# Patient Record
Sex: Male | Born: 1990 | Race: Black or African American | Hispanic: No | Marital: Single | Smoking: Current every day smoker
Health system: Southern US, Community
[De-identification: ages and names within clinical notes are randomized; demographics above are authoritative.]

## PROBLEM LIST (undated history)

## (undated) ENCOUNTER — Emergency Department (HOSPITAL_COMMUNITY): Admission: EM | Discharge status: Absent without leave | Payer: Self-pay

## (undated) DIAGNOSIS — R569 Unspecified convulsions: Secondary | ICD-10-CM

## (undated) HISTORY — PX: FOOT SURGERY: SHX648

---
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
## 2009-11-17 ENCOUNTER — Emergency Department (HOSPITAL_COMMUNITY): Admission: EM | Admit: 2009-11-17 | Discharge: 2009-11-18 | Payer: Self-pay | Admitting: Emergency Medicine

## 2010-01-06 ENCOUNTER — Ambulatory Visit: Payer: Self-pay | Admitting: Family Medicine

## 2010-02-06 ENCOUNTER — Encounter: Payer: Self-pay | Admitting: Family Medicine

## 2010-02-06 ENCOUNTER — Ambulatory Visit: Payer: Self-pay | Admitting: Family Medicine

## 2010-02-09 LAB — CONVERTED CEMR LAB
BUN: 16 mg/dL (ref 6–23)
CO2: 22 meq/L (ref 19–32)
Calcium: 9.3 mg/dL (ref 8.4–10.5)
Chloride: 109 meq/L (ref 96–112)
Cholesterol: 124 mg/dL (ref 0–200)
Creatinine, Ser: 0.99 mg/dL (ref 0.40–1.50)
Glucose, Bld: 89 mg/dL (ref 70–99)
HDL: 34 mg/dL — ABNORMAL LOW (ref >39)
LDL Cholesterol: 81 mg/dL (ref 0–99)
Potassium: 4.1 meq/L (ref 3.5–5.3)
Sodium: 142 meq/L (ref 135–145)
TSH: 3.444 microintl units/mL (ref 0.350–4.500)
Total CHOL/HDL Ratio: 3.6
Triglycerides: 45 mg/dL (ref <150)
VLDL: 9 mg/dL (ref 0–40)

## 2010-03-05 ENCOUNTER — Ambulatory Visit: Payer: Self-pay | Admitting: Family Medicine

## 2010-08-25 ENCOUNTER — Ambulatory Visit: Payer: Self-pay | Admitting: Family Medicine

## 2010-09-15 ENCOUNTER — Encounter: Payer: Self-pay | Admitting: Family Medicine

## 2010-09-17 ENCOUNTER — Emergency Department (HOSPITAL_COMMUNITY)
Admission: EM | Admit: 2010-09-17 | Discharge: 2010-09-17 | Payer: Self-pay | Source: Home / Self Care | Admitting: Emergency Medicine

## 2010-09-17 LAB — COMPREHENSIVE METABOLIC PANEL
ALT: 22 U/L (ref 0–53)
AST: 16 U/L (ref 0–37)
Albumin: 3.5 g/dL (ref 3.5–5.2)
Alkaline Phosphatase: 66 U/L (ref 39–117)
BUN: 10 mg/dL (ref 6–23)
CO2: 26 mEq/L (ref 19–32)
Calcium: 9 mg/dL (ref 8.4–10.5)
Chloride: 107 mEq/L (ref 96–112)
Creatinine, Ser: 1.05 mg/dL (ref 0.4–1.5)
GFR calc Af Amer: >60 mL/min (ref >60)
GFR calc non Af Amer: >60 mL/min (ref >60)
Glucose, Bld: 87 mg/dL (ref 70–99)
Potassium: 3.8 mEq/L (ref 3.5–5.1)
Sodium: 139 mEq/L (ref 135–145)
Total Bilirubin: 0.5 mg/dL (ref 0.3–1.2)
Total Protein: 6.9 g/dL (ref 6.0–8.3)

## 2010-09-17 LAB — DIFFERENTIAL
Basophils Absolute: 0 10*3/uL (ref 0.0–0.1)
Basophils Relative: 0 % (ref 0–1)
Eosinophils Absolute: 0 10*3/uL (ref 0.0–0.7)
Eosinophils Relative: 1 % (ref 0–5)
Lymphocytes Relative: 23 % (ref 12–46)
Lymphs Abs: 1.3 10*3/uL (ref 0.7–4.0)
Monocytes Absolute: 0.5 10*3/uL (ref 0.1–1.0)
Monocytes Relative: 8 % (ref 3–12)
Neutro Abs: 3.9 10*3/uL (ref 1.7–7.7)
Neutrophils Relative %: 68 % (ref 43–77)

## 2010-09-17 LAB — CBC
HCT: 44.9 % (ref 39.0–52.0)
Hemoglobin: 15.1 g/dL (ref 13.0–17.0)
MCH: 29.9 pg (ref 26.0–34.0)
MCHC: 33.6 g/dL (ref 30.0–36.0)
MCV: 88.9 fL (ref 78.0–100.0)
Platelets: 206 10*3/uL (ref 150–400)
RBC: 5.05 MIL/uL (ref 4.22–5.81)
RDW: 13.5 % (ref 11.5–15.5)
WBC: 5.7 10*3/uL (ref 4.0–10.5)

## 2010-09-17 LAB — LIPASE, BLOOD: Lipase: 14 U/L (ref 11–59)

## 2010-10-15 NOTE — Assessment & Plan Note (Signed)
Summary: nutrition counseling   Vital Signs:  Patient profile:   20 year old male Height:      65.25 inches Weight:      278.2 pounds BMI:     46.11  Primary Care Provider:  Marisue Ivan  MD   History of Present Illness: 20yo M here for nutritional counseling  24 hour recall: 7am- Cheerios (3 cups) in 2% milk and water; 11am- spinach salad w/ tomatoes, cucumbers, carrots with grilled chicken breast w/ lite basalmic dressing with water; 7pm- Grilled chicken, stir fry spinach w/ olive oil, salad, water;   Physical Activity: Walking every morning; playing basketball several hours a day  Hx of weight loss programs: None  Current Medications (verified): 1)  None  Allergies (verified): No Known Drug Allergies  Physical Exam  General:  VS Reviewed. Morbidly obese, NAD.    Impression & Recommendations:  Problem # 1:  OBESITY, NOS (ICD-278.00) Assessment Improved  BMI 46 Loss 4 lbs since previous visit.  Pt doing a great job of losing weight, maintaing physical activity, and has proper overall nutrition. Good family and friend support. Interventions: Advised to implement afternoon snack (fruits or yogurt).  titrate down to skim milk.  Implement weight training.   Follow up with Dr. Gerilyn Pilgrim in 4 weeks.  Orders: [facility]- Est Level  3 (16109)  Patient Instructions: 1)  Follow up with Dr. Gerilyn Pilgrim in 4 weeks 2)  Recommendations- Try to slowly titrate down the milk to skim milk. 3)  Consider a mid-afternoon snack (avoid going 5 hours without eating something) 4)  Good food choices: Fruits, yogurt, frozen fruit pops. 5)  Encourage regular calcium intake. 6)  If you are exercising for more than an hour, consider taking in Gatorade (G2 or propel). 7)  If you are still having muscle spasms and cramps, please call the clinic to be evaluated.

## 2010-10-15 NOTE — Progress Notes (Signed)
Summary: ROI  ROI   Imported By: De Nurse 09/24/2010 16:41:15  _____________________________________________________________________  External Attachment:    Type:   Image     Comment:   External Document

## 2010-10-15 NOTE — Assessment & Plan Note (Signed)
Summary: obesity f/u   Vital Signs:  Patient profile:   20 year old male Height:      66 inches Weight:      282.3 pounds BMI:     45.73 Temp:     97.7 degrees F oral Pulse rate:   45 / minute BP sitting:   132 / 79  (left arm) Cuff size:   large  Vitals Entered By: Gladstone Pih (Feb 06, 2010 11:04 AM) CC: F/U wt loss Is Patient Diabetic? No Pain Assessment Patient in pain? no        Primary Care Provider:  Marisue Ivan  MD  CC:  F/U wt loss.  History of Present Illness: 20yo Obese male here for f/u  Obesity: Has lost 3 lbs since last visit last month.  States that he has been playing basketball everyday for 2-6 hours.  Also reports changes in his nutrition.  No longer frying foods or eating at fast food resturants.  Eating more salads, vegetables, and fruits.  States that he has friends that keep him accountable for the physical activity and family to help with food choices.  Denies any CP, SOB, or syncopal events when playing basketball.  Sleeping better and feeling better overall.    Habits & Providers  Alcohol-Tobacco-Diet     Tobacco Status: never  Current Medications (verified): 1)  None  Allergies (verified): No Known Drug Allergies  Review of Systems      See HPI  Physical Exam  General:  VS Reviewed. Morbidly obese, NAD.  Lungs:  Normal respiratory effort, chest expands symmetrically. Lungs are clear to auscultation, no crackles or wheezes. Heart:  Normal rate and regular rhythm. S1 and S2 normal without gallop, murmur, click, rub or other extra sounds.   Impression & Recommendations:  Problem # 1:  OBESITY, NOS (ICD-278.00) Assessment Improved Lost 3lbs since last visit. Improvement in physical activity and nutrition. I praised him and offered encouragement. Plan to have him return in 2-3 weeks for nutrition clinic.  Orders: Lipid-[facility] ([id number]) Basic Met-[facility] [phone]) TSH-[facility] ([id number]) [facility]- Est Level  3 (57846)  Problem  # 2:  Preventive Health Care (ICD-V70.0) Assessment: Comment Only He missed his labs prior. Will check TSH, BMET, and Lipid panel to r/o hypothyroidism, diabetes, and hyperlipidemia.  Patient Instructions: 1)  Schedule an appt with Dr. Burnadette Pop in nutrition clinic on Thurs afternnon either June 9th, 16th, or 23rd. 2)  Great job on the weight loss and lifestyle changes. 3)  I'm checking some blood work today to rule out diabetes, high cholesterol.

## 2010-10-15 NOTE — Assessment & Plan Note (Signed)
Summary: 20yo wellness visit   Vital Signs:  Patient profile:   20 year old male Height:      66 inches Weight:      285.1 pounds BMI:     46.18 Temp:     97.9 degrees F oral Pulse rate:   48 / minute BP sitting:   136 / 80  (left arm) Cuff size:   large  Vitals Entered By: Gladstone Pih (January 06, 2010 1:36 PM) CC: CPE Is Patient Diabetic? No Pain Assessment Patient in pain? no        Primary Care Provider:  Towana Badger MD  CC:  CPE.  History of Present Illness: 20yo M here for wellness visit  Concerns: R wrist bothering him x 5 months.  Intermittently painful in the joint with occasional swelling.  No redness, brusing, or fevers.    Habits & Providers  Alcohol-Tobacco-Diet     Tobacco Status: never  Current Medications (verified): 1)  None  Allergies (verified): No Known Drug Allergies  Past History:  Past Medical History: Obesity Mild MR  Past Surgical History: R foot surgery  Family History: F-migraines, hypercholesterolemia M-obesity; migraines MGM-CVA in 40s?  Social History: Lives with mom, stepdad, sister, brother in apartment in [place];  Unemployed No tobacco, no EtOH, no illicit drug use Exercises daily- plays basketball and football Smoking Status:  never  Review of Systems       no chest pain, SOB, palpitations, or syncopal episodes with exertion or while at rest  Physical Exam  General:  VS Reviewed. Morbidly obese, NAD.  Eyes:  No corneal or conjunctival inflammation noted. EOMI. Perrla.  Vision grossly normal. Mouth:  Oral mucosa and oropharynx without lesions or exudates.  Teeth in good repair. Neck:  supple, full ROM, no goiter or mass  Lungs:  Normal respiratory effort, chest expands symmetrically. Lungs are clear to auscultation, no crackles or wheezes. Heart:  Normal rate and regular rhythm. S1 and S2 normal without gallop, murmur, click, rub or other extra sounds. Abdomen:  Soft, obese, NT, ND, no HSM, active  BS  Msk:  R wrist: no obvious deformities, ecchymosis, effusion, or erythema Full ROM nl strenghth no ttp Extremities:  no edema Neurologic:  no focal deficits Skin:  no skin lesions Cervical Nodes:  no LAD   Impression & Recommendations:  Problem # 1:  WELL CHILD EXAMINATION (ICD-V20.2) Assessment Unchanged  Pt is morbidly obese- greater than 50% of time spent counseling on importance of healthy weight loss and strategies on wt loss. Plan to check lipid panel and CMET when fasting to assess for HLD and DM. Plan to f/u in 1 month to reassess wt loss. Anticipatory guidance discussed regarding overall safety.  Orders: [facility] - Est  18-39 yrs [phone])  Other Orders: Future Orders: Comp Met-[facility] [id number]) ... 12/22/2010 Lipid-[facility] ([id number]) ... 12/22/2010  Patient Instructions: 1)  Please schedule a follow-up appointment in 1 month to reassess weight loss. 2)  Come back this week to get your lab work. 3)  A goal for weight loss is 2lbs a month.

## 2010-10-15 NOTE — Assessment & Plan Note (Signed)
Summary: WELL CHILD CHECK/[facility]    Vital Signs:  Patient profile:   20 year old male Height:      65.55 inches (166.5 cm) Weight:      263 pounds BMI:     43.19 Temp:     97.6 degrees F oral Pulse rate:   64 / minute BP sitting:   133 / 80  (right arm) Cuff size:   large  Vitals Entered By: Tessie Fass CMA (August 25, 2010 2:15 PM) CC: wcc  Vision Screening:Left eye w/o correction: 20 / 30 Right Eye w/o correction: 20 / 50 Both eyes w/o correction:  20/ 30        Vision Entered By: Tessie Fass CMA (August 25, 2010 2:16 PM)   Well Child Visit/Preventive Care  Age:  20 years old male Concerns: Doing well. Works at [employer] and lives out of the house. Would like to lose weight.   Home:     good family relationships Activities:     friends and Job Auto/Safety:     seatbelts Diet:     Too much fast food Drugs:     no tobacco use, no alcohol use, and no drug use Sex:     safe sex Suicide risk:     emotionally healthy  Past History:  Past Medical History: Last updated: 01/06/2010 Obesity Mild MR  Past Surgical History: Last updated: 01/06/2010 R foot surgery  Family History: Last updated: 01/06/2010 F-migraines, hypercholesterolemia M-obesity; migraines MGM-CVA in 40s?  Social History: Works at [employer] No tobacco, no EtOH, no illicit drug use Exercises daily- plays basketball and football  Review of Systems  The patient denies anorexia, fever, weight loss, headaches, and abdominal pain.    Physical Exam  General:      VS Reviewed. Morbidly obese, NAD.  Head:      normocephalic and atraumatic  Eyes:      No corneal or conjunctival inflammation noted. EOMI. Perrla.  Vision grossly normal. Nose:      Clear without erythema, edema or exudate  Mouth:      Oral mucosa and oropharynx without lesions or exudates.  Teeth in good repair. Neck:      supple, full ROM, no goiter or mass  Lungs:      Normal respiratory effort, chest  expands symmetrically. Lungs are clear to auscultation, no crackles or wheezes. Heart:      Normal rate and regular rhythm. S1 and S2 normal without gallop, murmur, click, rub or other extra sounds. Abdomen:      Soft, obese, NT, ND, no HSM, active BS  Pulses:      2+ DP pulses bilaterally Extremities:      no edema Neurologic:      no focal deficits Developmental:      alert and cooperative  Skin:      no skin lesions Psychiatric:      alert and cooperative   Impression & Recommendations:  Problem # 1:  WELL CHILD EXAMINATION (ICD-V20.2) Doing well today.  May need glasses. Will recheck at the next visit.  Plan to follow up as below. Weight is an issue.  Orders: [facility] - [place] [phone]) [facility] - Est  18-39 yrs [phone])  Problem # 2:  OBESITY, NOS (ICD-278.00) Assessment: Deteriorated Has gaines back weight.  Would like to lose weight again. Thinks he did well with our intervantion last time. Will follow up with me in 1 month and Dr. Gerilyn Pilgrim.   Other Orders: Flu Vaccine 64yrs + (  16109) Admin 1st Vaccine ([id number])   Immunizations Administered:  Influenza Vaccine # 1:    Vaccine Type: Fluvax 3+    Site: left deltoid    Mfr: GlaxoSmithKline    Dose: 0.5 ml    Route: IM    Given by: Tessie Fass CMA    Exp. Date: 03/13/2011    Lot #: [id number]    VIS given: 04/07/10 version given August 25, 2010.  Flu Vaccine Consent Questions:    Do you have a history of severe allergic reactions to this vaccine? no    Any prior history of allergic reactions to egg and/or gelatin? no    Do you have a sensitivity to the preservative Thimersol? no    Do you have a past history of Guillan-Barre Syndrome? no    Do you currently have an acute febrile illness? no    Have you ever had a severe reaction to latex? no    Vaccine information given and explained to patient? yes  Patient Instructions: 1)  Thank you for seeing me today. 2)  Please go see Dr. Gerilyn Pilgrim to talk about weight loss. Make an  appointment at the front desk.  3)  Come back in 1 month to talk about weight loss.  ]

## 2010-12-15 ENCOUNTER — Emergency Department (HOSPITAL_COMMUNITY)
Admission: EM | Admit: 2010-12-15 | Discharge: 2010-12-15 | Discharge status: Against Medical Advice | Payer: Medicaid Other | Attending: Emergency Medicine | Admitting: Emergency Medicine

## 2010-12-15 DIAGNOSIS — M542 Cervicalgia: Secondary | ICD-10-CM | POA: Insufficient documentation

## 2010-12-15 DIAGNOSIS — Y9289 Other specified places as the place of occurrence of the external cause: Secondary | ICD-10-CM | POA: Insufficient documentation

## 2010-12-23 ENCOUNTER — Inpatient Hospital Stay (INDEPENDENT_AMBULATORY_CARE_PROVIDER_SITE_OTHER)
Admission: RE | Admit: 2010-12-23 | Discharge: 2010-12-23 | Disposition: A | Discharge status: Home / Self Care | Payer: Self-pay | Source: Ambulatory Visit | Attending: Family Medicine | Admitting: Family Medicine

## 2010-12-23 DIAGNOSIS — J020 Streptococcal pharyngitis: Secondary | ICD-10-CM

## 2010-12-23 LAB — POCT RAPID STREP A (OFFICE): Streptococcus, Group A Screen (Direct): POSITIVE — AB

## 2011-01-29 NOTE — Op Note (Signed)
NAME:  ROMELO, SCIANDRA                          ACCOUNT NO.:  [id number]   MEDICAL RECORD NO.:  [id number]                   PATIENT TYPE:  AMB   LOCATION:  [facility]                                  FACILITY:  [facility]   PHYSICIAN:  Doralee Albino. Carola Frost, M.D.              DATE OF BIRTH:  1991-05-29   DATE OF PROCEDURE:  05/13/2004  DATE OF DISCHARGE:                                 OPERATIVE REPORT   PREOPERATIVE DIAGNOSIS:  Right foot mass.   POSTOPERATIVE DIAGNOSIS:  Right foot mass.   OPERATION PERFORMED:  Excisional biopsy of right foot mass.   SURGEON:  Doralee Albino. Carola Frost, M.D.   ASSISTANT:  Cecil Cranker, PA   ANESTHESIA:  General.   COMPLICATIONS:  None.   DRAINS:  One 10 French Jackson-Pratt drain.   ESTIMATED BLOOD LOSS:  Minimal.   SPECIMENS:  One to pathology and anaerobic/aerobic to microbiology.   DISPOSITION:  To PACU.   CONDITION:  Stable.   INDICATIONS FOR PROCEDURE:  Nesanel Aguila is a 20 year old black male who had  his right food punctured back in January.  He was treated as an outpatient  with two rounds of antibiotics but did not undergo surgery.  His signs of  acute infection resolved, but he continued to have a painful right foot  mass.  Plain x-rays and MRI did not demonstrate bone involvement but there  were findings suggestive of a soft tissue abscess. After discussion of the  risks and benefits with the patient and his mother, they elected to proceed.  They did understand the risk of nerve, vessel injury, recurrent and  subsequent breakdown and infection.  They understood he would need to remain  nonweightbearing for several weeks until his incision heals completely.   DESCRIPTION OF PROCEDURE:  Antibiotics were held preoperatively for [facility].  His right lower extremity was prepped and draped in the usual sterile  fashion.  Tourniquet was placed on the leg but never inflated throughout the  case.  A 4 cm incision directly over the mass was made  longitudinally.  Dissection carried down around the mass at the subcu level.  There was  extensive fibrosis encountered.  A Kocher was used to grasp the mass and  then a combination of scissors, hemostat and sharp knife were used to  delineate it circumferentially and excise it in bulk.  There was a sizable  spicule extending out laterally which was also excised.  No significant  bleeding was encountered.  There was a considerable amount of membranous  tissue which appeared to represent a pseudocapsule.  This mass was then cut  on the back table and some of this tissue sent to microbiology for aerobic  cultures, some of it sent for anaerobic culture.  There were also samples  taken from the margin of the membranous tissue to improve the yield of the  specimen.  The bulk of the specimen was  sent to pathology for analysis.  The  wound was then irrigated in pulsatile fashion with a liter of normal saline  and the wound closed in standard layered fashion with 2-0 Vicryl and 3-0  nylon using a horizontal mattress after first placing a 10 French drain into  the cavity.  Fluff dressing and posterior splint was applied.  The patient's  foot was placed in the appropriate amount of extension and the splint  allowed to harden.  He was awakened and taken to the PACU without  complication.   PROGNOSIS:  Remains to be seen where a nidus of foreign material was  identified or whether cultures will turn positive for [place].  The drain was  placed to facilitate resolution of his swelling and we hope to reduce  possibility of wound complication.  He will return to the office tomorrow to  have this pulled or it may be pulled at home by his mother.  He was given  gentamycin after obtaining the culture and he was sent home with a 10 day  course of Cipro.  The antibiotics were selected based on a presumption that  Pseudomonas would be the most likely organism.  Again, pending these culture  results, we will  contact infectious disease for consultation.  He will  remain nonweightbearing and in a splint for two weeks.  At that time we will  remove it and assess him for wound healing.  He does remain at risk for  wound breakdown, particularly given the fact that his mother states that she  will need to be with him to prevent noncompliance.  He will return in two  weeks.                                               Doralee Albino. Carola Frost, M.D.    [facility]/MEDQ  D:  05/13/2004  T:  05/14/2004  Job:  [id number]

## 2011-02-16 ENCOUNTER — Ambulatory Visit: Payer: Self-pay | Admitting: Family Medicine

## 2011-11-03 ENCOUNTER — Encounter (HOSPITAL_COMMUNITY): Payer: Self-pay | Admitting: Emergency Medicine

## 2011-11-03 ENCOUNTER — Emergency Department (INDEPENDENT_AMBULATORY_CARE_PROVIDER_SITE_OTHER): Payer: Medicaid Other

## 2011-11-03 ENCOUNTER — Emergency Department (INDEPENDENT_AMBULATORY_CARE_PROVIDER_SITE_OTHER)
Admission: EM | Admit: 2011-11-03 | Discharge: 2011-11-03 | Disposition: A | Discharge status: Home Health | Payer: Medicaid Other | Source: Home / Self Care | Attending: Emergency Medicine | Admitting: Emergency Medicine

## 2011-11-03 DIAGNOSIS — S93409A Sprain of unspecified ligament of unspecified ankle, initial encounter: Secondary | ICD-10-CM

## 2011-11-03 MED ORDER — IBUPROFEN 800 MG PO TABS: 800.0000 mg | ORAL_TABLET | Freq: Three times a day (TID) | ORAL | Status: AC | PRN

## 2011-11-03 NOTE — ED Notes (Signed)
PT HERE WITH LEFT MEDIAL ANKLE PAIN RADIATING TO SOLE AREA S/P INJURY YESTERDAY.PT STATES HE FELL DOWN HILL YESTERDAY WHILE WALKING AND HEARD ANKLE POP TWICE.PAI WITH PRESSURE AND MIN PLANTAR FLEX OR LIFTING.ICE APPLIED AND OTC MEDS

## 2011-11-03 NOTE — Discharge Instructions (Signed)
Ankle Sprain °An ankle sprain is an injury to the ligaments that hold the ankle joint together.  °CAUSES °The injury is usually caused by a fall or by twisting the ankle. It is important to tell your caregiver how the injury occurred and whether or not you were able to walk immediately after the injury.  °SYMPTOMS  °Pain is the primary symptom. It may be present at rest or only when you are trying to stand or walk. The ankle will likely be swollen. Bruising may develop immediately or after 1 or 2 days. It may be difficult or impossible to stand or walk. This depends on the severity of the sprain. °DIAGNOSIS  °Your caregiver can determine if a sprain has occurred based on the accident details and on examination of your ankle. Examination will include pressing and squeezing areas of the foot and ankle. Your caregiver will try to move the ankle in certain ways. X-rays may be used to be sure a bone was not broken, or that the ligament did not pull off of a bone (avulsion). There are standard guidelines that can reliably determine if an X-ray is needed. °TREATMENT  °Rest, ice, elevation, and compression are the basic modes of treatment. Certain types of braces can help stabilize the ankle and allow early return to walking. Your caregiver can make a recommendation for this. Medication may be recommended for pain. You may be referred to an orthopedist or a physical therapist for certain types of severe sprains. °HOME CARE INSTRUCTIONS  °· Apply ice to the sore area for 15 to 20 minutes, 3 to 4 times per day. Do this while you are awake for the first 2 days, or as directed. This can be stopped when the swelling goes away. Put the ice in a plastic bag and place a towel between the bag of ice and your skin.  °· Keep your leg elevated when possible to lessen swelling.  °· If your caregiver recommends crutches, use them as instructed with a non-weight bearing cast for 1 week. Then, you may walk on your ankle as the pain allows,  or as instructed. Gradually, put weight on the affected ankle. Continue to use crutches or a cane until you can walk without causing pain.  °· If a plaster splint was applied, wear the splint until you are seen for a follow-up examination. Rest it on nothing harder than a pillow the first 24 hours. Do not put weight on it. Do not get it wet. You may take it off to take a shower or bath.  °· You may have been given an elastic bandage to use with the plaster splint, or you may have been given a elastic bandage to use alone. The elastic bandage is too tight if you have numbness, tingling, or if your foot becomes cold and blue. Adjust the bandage to make it comfortable.  °· If an air splint was applied, you may blow more air into it or take some out to make it more comfortable. You may take it off at night and to take a shower or bath. Wiggle your toes in the splint several times per day if you are able.  °· Only take over-the-counter or prescription medicines for pain, discomfort, or fever as directed by your caregiver.  °· Do not drive a vehicle until your caregiver specifically tells you it is safe to do so.  °SEEK MEDICAL CARE IF:  °· You have an increase in bruising, swelling, or pain.  °· Your   toes feel cold.  °· Pain relief is not achieved with medications.  °SEEK IMMEDIATE MEDICAL CARE IF: °Your toes are numb or blue or you have severe pain. °MAKE SURE YOU:  °· Understand these instructions.  °· Will watch your condition.  °· Will get help right away if you are not doing well or get worse.  °Document Released: 08/30/2005 Document Revised: 12/04/2010 Document Reviewed: 04/03/2008 °ExitCare® Patient Information ©2012 [employer]. °

## 2011-11-03 NOTE — ED Provider Notes (Signed)
History     CSN: [id number]  Arrival date & time 11/03/11  1454   First MD Initiated Contact with Patient 11/03/11 1606      Chief Complaint  Patient presents with  . Ankle Pain  . Foot Injury    (Consider location/radiation/quality/duration/timing/severity/associated sxs/prior treatment) Patient is a 21 y.o. male presenting with ankle pain and foot injury. The history is provided by the patient.  Ankle Pain  The incident occurred yesterday. The incident occurred at home. The injury mechanism was a fall. The pain is present in the left ankle. The pain is at a severity of 8/10. The pain is moderate. The pain has been constant since onset. Pertinent negatives include no numbness, no loss of motion and no loss of sensation. He reports no foreign bodies present. The symptoms are aggravated by activity and bearing weight. He has tried nothing for the symptoms. The treatment provided no relief.  Foot Injury  Pertinent negatives include no numbness, no loss of motion and no loss of sensation.    History reviewed. No pertinent past medical history.  Past Surgical History  Procedure Date  . Foot surgery     No family history on file.  History  Substance Use Topics  . Smoking status: Never Smoker   . Smokeless tobacco: Not on file  . Alcohol Use: No      Review of Systems  Constitutional: Negative for fever, appetite change and fatigue.  Musculoskeletal: Positive for joint swelling.  Neurological: Negative for numbness.    Allergies  Review of patient's allergies indicates no known allergies.  Home Medications  No current outpatient prescriptions on file.  BP 119/57  Pulse 66  Temp(Src) 97.9 F (36.6 C) (Oral)  Resp 14  SpO2 98%  Physical Exam  Nursing note and vitals reviewed. Constitutional: He appears well-developed and well-nourished. No distress.  Musculoskeletal: He exhibits tenderness.       Left foot: He exhibits decreased range of motion, tenderness and  swelling. He exhibits no bony tenderness, normal capillary refill, no crepitus, no deformity and no laceration.       Feet:  Neurological: He is alert.  Skin: Skin is warm. No erythema.    ED Course  Procedures (including critical care time)  Labs Reviewed - No data to display No results found.   No diagnosis found.    MDM          Jimmie Molly, MD 11/03/11 [phone]

## 2011-12-17 ENCOUNTER — Encounter: Payer: Medicaid Other | Admitting: Family Medicine

## 2012-09-29 ENCOUNTER — Ambulatory Visit (INDEPENDENT_AMBULATORY_CARE_PROVIDER_SITE_OTHER): Payer: Medicaid Other | Admitting: Family Medicine

## 2012-09-29 ENCOUNTER — Encounter: Payer: Self-pay | Admitting: Family Medicine

## 2012-09-29 VITALS — BP 137/80 | HR 63 | Temp 98.0°F | Wt 310.9 lb

## 2012-09-29 DIAGNOSIS — M545 Low back pain: Secondary | ICD-10-CM | POA: Insufficient documentation

## 2012-09-29 DIAGNOSIS — Z23 Encounter for immunization: Secondary | ICD-10-CM

## 2012-09-29 DIAGNOSIS — R030 Elevated blood-pressure reading, without diagnosis of hypertension: Secondary | ICD-10-CM

## 2012-09-29 DIAGNOSIS — Z6841 Body Mass Index (BMI) 40.0 and over, adult: Secondary | ICD-10-CM

## 2012-09-29 LAB — BASIC METABOLIC PANEL
BUN: 11 mg/dL (ref 6–23)
CO2: 26 mEq/L (ref 19–32)
Calcium: 10.3 mg/dL (ref 8.4–10.5)
Chloride: 105 mEq/L (ref 96–112)
Creat: 0.86 mg/dL (ref 0.50–1.35)
Glucose, Bld: 84 mg/dL (ref 70–99)
Potassium: 4.8 mEq/L (ref 3.5–5.3)
Sodium: 142 mEq/L (ref 135–145)

## 2012-09-29 MED ORDER — CYCLOBENZAPRINE HCL 10 MG PO TABS: 10.0000 mg | ORAL_TABLET | Freq: Every evening | ORAL | Status: DC | PRN

## 2012-09-29 MED ORDER — IBUPROFEN 600 MG PO TABS: 600.0000 mg | ORAL_TABLET | Freq: Three times a day (TID) | ORAL | Status: DC | PRN

## 2012-09-29 NOTE — Progress Notes (Signed)
  Subjective:    Patient ID: NAJI MEHRINGER, male    DOB: Aug 06, 1991, 22 y.o.   MRN: [id number]  HPI  He has know he has been overweight.  He and his wife seemed to take it seriously when I told him his BMI was 50 and put that number in perspective.    Low back pain.  Chronic intermitant.  No trauma.  Some leg pain Right>left,  No true sciatica.  Denies bowel or bladder.  Clearly, his weight puts him at risk for mechanical back problems.    Review of Systems     Objective:   Physical Exam  HEENT nl Neck no nodes Lungs clear Cardiac RRR without m or g Adb, benign as best I can tell Ext 1+ symetric edema      Assessment & Plan:

## 2012-09-29 NOTE — Patient Instructions (Signed)
I will call with blood work results Please work at [employer] loss and a healthy diet.  You are at an unhealthy weight. Avoid salt/sodium - you have the early stages of high blood pressure I prescribed pain pills (ibuprofen) and a muscle relaxer (cyclobenzprene) for your back.  Only take these meds when your back flairs up on you. The single most important thing for your long term health is weight loss You got tetanus and flu shots today.  Get a flu shot every year.  Tetanus shots are good for 10 years.

## 2012-09-30 ENCOUNTER — Encounter: Payer: Self-pay | Admitting: Family Medicine

## 2012-09-30 NOTE — Assessment & Plan Note (Signed)
The single biggest issue in his health is his weight.  Discussed importance.

## 2012-09-30 NOTE — Assessment & Plan Note (Signed)
Emphasized chronic intermitant nature.  Develop good back habits plus exercise.  Explained limited, intermitant use of meds.

## 2012-10-02 ENCOUNTER — Telehealth: Payer: Self-pay | Admitting: *Deleted

## 2012-10-02 NOTE — Telephone Encounter (Signed)
Message copied by Farrell Ours on Mon Oct 02, 2012 11:52 AM ------      Message from: Tivis Ringer      Created: Sat Sep 30, 2012  1:21 PM       Please call him on Monday, tell him his blood work was fine, and wish him good luck with his weight loss.  Thanks.      ----- Message -----         From: [facility] Interface         Sent: 09/29/2012  10:45 PM           To: Sanjuana Letters, MD

## 2012-10-02 NOTE — Telephone Encounter (Signed)
Spoke with patient's wife and informed her of below 

## 2013-04-11 ENCOUNTER — Ambulatory Visit: Payer: Medicaid Other | Admitting: Family Medicine

## 2013-07-04 ENCOUNTER — Encounter: Payer: Self-pay | Admitting: Family Medicine

## 2013-07-04 ENCOUNTER — Ambulatory Visit (INDEPENDENT_AMBULATORY_CARE_PROVIDER_SITE_OTHER): Payer: Medicaid Other | Admitting: Family Medicine

## 2013-07-04 VITALS — BP 127/56 | HR 68 | Temp 99.1°F | Ht 65.5 in | Wt 282.0 lb

## 2013-07-04 DIAGNOSIS — Z23 Encounter for immunization: Secondary | ICD-10-CM

## 2013-07-04 DIAGNOSIS — M545 Low back pain: Secondary | ICD-10-CM

## 2013-07-04 DIAGNOSIS — Z0000 Encounter for general adult medical examination without abnormal findings: Secondary | ICD-10-CM

## 2013-07-04 DIAGNOSIS — Z6841 Body Mass Index (BMI) 40.0 and over, adult: Secondary | ICD-10-CM

## 2013-07-04 DIAGNOSIS — R030 Elevated blood-pressure reading, without diagnosis of hypertension: Secondary | ICD-10-CM

## 2013-07-04 NOTE — Progress Notes (Signed)
  Subjective:    Patient ID: Timothy Haynes, male    DOB: 04/11/1991, 22 y.o.   MRN: [id number]  HPI Here for physical/HPDP Skin problem needs to be checked. Has lost 28 lbs since Jan by eating healthier.   Back no longer hurts him No complaints. Needs both flu and tetanus vaccine.    FHx + HBP and DM.  Explained connection to weight.   Had lipid panel 3 years ago.  Currenct recs are q5y.   Review of Systems     Objective:   Physical ExamHEENT normal Neck supple Lungs clear Abd benign Ext no edema       Assessment & Plan:

## 2013-07-04 NOTE — Patient Instructions (Signed)
Great job on the weight loss.  You have lost 28 lbs since January. You got a flu shot today.  You need one every year. You will get a tetanus shot today.  They are good for 10 years. Keep up the diet and good health habits.  I'll see you in one year.

## 2013-07-05 DIAGNOSIS — Z0000 Encounter for general adult medical examination without abnormal findings: Secondary | ICD-10-CM | POA: Insufficient documentation

## 2013-07-05 DIAGNOSIS — Z23 Encounter for immunization: Secondary | ICD-10-CM

## 2013-07-05 NOTE — Assessment & Plan Note (Signed)
States no back pain since wt loss.

## 2013-07-05 NOTE — Assessment & Plan Note (Signed)
Good control with wt loss and diet changes.

## 2013-07-05 NOTE — Assessment & Plan Note (Signed)
Outstanding 28 lb wt loss since Jan.  Keep it up.  His goal wt is under 200 LBs

## 2013-07-05 NOTE — Assessment & Plan Note (Signed)
No unhealthy habits.  Obesity, which he is addressing, is his big issue, esp when considering his FHx.  Tetanus and flu shots today.

## 2013-11-16 ENCOUNTER — Encounter: Payer: Medicaid Other | Admitting: Family Medicine

## 2014-01-01 ENCOUNTER — Encounter: Payer: Self-pay | Admitting: Family Medicine

## 2014-01-01 ENCOUNTER — Ambulatory Visit (INDEPENDENT_AMBULATORY_CARE_PROVIDER_SITE_OTHER): Payer: Medicaid Other | Admitting: Family Medicine

## 2014-01-01 VITALS — BP 144/86 | HR 64 | Temp 97.7°F | Wt 286.0 lb

## 2014-01-01 DIAGNOSIS — M7989 Other specified soft tissue disorders: Secondary | ICD-10-CM | POA: Insufficient documentation

## 2014-01-01 NOTE — Assessment & Plan Note (Signed)
A: Patient with unilateral swelling, pain and report of a red streak a few days ago. No known injury. Concern for VTE. Precepted with Dr. Randolm IdolFletke who agrees this should be ruled out. DDx includes DVT, superficial thrombus, nerve injury, or infection although that is less likely because of no fevers or cause of infection.  P: - Venous doppler - Elevate leg - Ibuprofen prn pain - f/u if not improved within one week or if he gets worse.

## 2014-01-01 NOTE — Patient Instructions (Signed)
I am sorry you are having pain. We will get an ultrasound tomorrow of your leg. Until then, keep it elevated. You can take Ibuprofen as needed for the pain.  We will call you with results, but come back in one week if not better.  Niccolo Burggraf M. Rajan Burgard, M.D.

## 2014-01-01 NOTE — Progress Notes (Signed)
Patient ID: Timothy Haynes, male   DOB: 10/17/1990, 23 y.o.   MRN: [id number]    Subjective: HPI: Patient is a 23 y.o. male presenting to clinic today for same day appointment for left foot swelling and pain.  Leg swelling- Left leg and foot pain and swelling x5 days. Swelling comes and goes, but pain is always there. Pain is worse with ambulation/weight on foot. No injury, no bites. Reports a red streak going up his leg a few days ago, red streak was extremely painful. Redness went away on its own. Reports "lumps" on bottom of foot yesterday when swelling was worse. He states the swelling goes away with rest/elevation. He has tried ice for the pain. Never had anything like this before. No recent travel. No known clotting disorder. No SOB or hemoptysis. Has had intentional weight loss.  History Reviewed: Non-smoker.  ROS: Please see HPI above.  Objective: Office vital signs reviewed. BP 144/86  Pulse 64  Temp(Src) 97.7 F (36.5 C) (Oral)  Wt 286 lb (129.729 kg)  Physical Examination:  General: Awake, alert. NAD. Diaphoretic.  HEENT: Atraumatic, normocephalic Pulm: CTAB, no wheezes Cardio: RRR Extremities: Left lower extremity edema, unable to appreciate true difference with measurements. No redness of lower extremity. TTP medial lower extremity distal to knee to ankle and across top of foot. Pain worse with foot dorsiflexion and extension. Ankle stable. Pulses intact. Neuro: Strength and sensation rossly intact  Assessment: 23 y.o. male with lower extremity pain and swelling  Plan: See Problem List and After Visit Summary

## 2014-01-02 ENCOUNTER — Ambulatory Visit (HOSPITAL_COMMUNITY)
Admission: RE | Admit: 2014-01-02 | Discharge: 2014-01-02 | Disposition: A | Discharge status: Home / Self Care | Payer: Medicaid Other | Source: Ambulatory Visit | Attending: Family Medicine | Admitting: Family Medicine

## 2014-01-02 ENCOUNTER — Encounter (HOSPITAL_COMMUNITY): Payer: Self-pay | Admitting: Vascular Surgery

## 2014-01-02 DIAGNOSIS — M7989 Other specified soft tissue disorders: Secondary | ICD-10-CM

## 2014-01-02 DIAGNOSIS — Z6841 Body Mass Index (BMI) 40.0 and over, adult: Secondary | ICD-10-CM

## 2014-01-02 DIAGNOSIS — M79609 Pain in unspecified limb: Secondary | ICD-10-CM | POA: Insufficient documentation

## 2014-01-02 NOTE — Progress Notes (Signed)
Left lower extremity venous duplex completed.  Left:  No evidence of DVT, superficial thrombosis, or Baker's cyst.  Right:  Negative for DVT in the common femoral vein.  

## 2014-01-31 ENCOUNTER — Ambulatory Visit: Payer: Medicaid Other | Admitting: Family Medicine

## 2014-04-04 ENCOUNTER — Encounter: Payer: Self-pay | Admitting: Family Medicine

## 2014-04-04 ENCOUNTER — Ambulatory Visit (INDEPENDENT_AMBULATORY_CARE_PROVIDER_SITE_OTHER): Payer: Medicaid Other | Admitting: Family Medicine

## 2014-04-04 VITALS — BP 135/74 | HR 87 | Temp 98.3°F | Ht 65.5 in | Wt 271.8 lb

## 2014-04-04 DIAGNOSIS — M545 Low back pain, unspecified: Secondary | ICD-10-CM | POA: Insufficient documentation

## 2014-04-04 MED ORDER — CYCLOBENZAPRINE HCL 10 MG PO TABS: 10.0000 mg | ORAL_TABLET | Freq: Three times a day (TID) | ORAL | Status: DC | PRN

## 2014-04-04 NOTE — Progress Notes (Signed)
Patient ID: Timothy Haynes, male   DOB: 08/20/1991, 23 y.o.   MRN: [id number]   Subjective:    Patient ID: Timothy OlpQuincy J Haynes, male    DOB: 01/19/1991, 23 y.o.   MRN: [id number]  HPI  CC: Back pain  # Low back pain:  Started new job 2 weeks ago, now works in Set designermanufacturing beds  On feet whole time 12 hour shift, picks up [employer] objects  Location: across whole lower back, radiates a little upwards  Character: feels like punching in back  Pain: 9/10 standing up, 5/10 sitting down  Taken: advil 500mg  2 times a day, helps sometimes. Gets massages from wife.  Has had back pain in the past, flexeril helped ROS: No numbness/tingling of legs, no bowel or bladder incontinence.   Review of Systems   See HPI for ROS. Objective:  BP 135/74  Pulse 87  Temp(Src) 98.3 F (36.8 C) (Oral)  Ht 5' 5.5" (1.664 m)  Wt 271 lb 12.8 oz (123.288 kg)  BMI 44.53 kg/m2  General: NAD Back: tender to palpation along lumbar spine. FROM of back. Demonstrates lifting a chair in office and bends at his waist, does not use legs to lift. Extremities: no edema or cyanosis. WWP. PT pulses 2+ bilaterally. Skin: warm and dry, no rashes noted Neuro: alert and oriented, no focal deficits. Gait is normal.     Assessment & Plan:  See Problem List Documentation

## 2014-04-04 NOTE — Patient Instructions (Signed)
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 LIFT WITH YOUR LEGS, NOT YOUR BACK!  Do the stretches and exercises in the printout, do these throughout the day at least 3 times a day.  For pain relief: Flexeril 10mg  3 times a day as needed (this is a muscle relaxer) Tylenol 500mg  every 6 hours as needed

## 2014-04-04 NOTE — Assessment & Plan Note (Signed)
Most likely musculoskeletal secondary to starting new job where he is on his feet for 12 hour shifts and lifting heavy objects. Demonstrated poor lifting technique in office today. No red flags or concerning symptoms for spinal compression. P: Went over and given handout for exercises/stretches to help with low back, encourage daily stretches and breaks during work. Flexeril 10mg  PRN and tylenol for pain. F/u as needed or if worsens/doesn't get better in 2 weeks. Note given to return to work tomorrow 7/24.

## 2014-08-29 ENCOUNTER — Ambulatory Visit: Payer: Medicaid Other | Admitting: Family Medicine

## 2014-08-29 ENCOUNTER — Encounter: Payer: Self-pay | Admitting: Family Medicine

## 2014-08-29 ENCOUNTER — Ambulatory Visit (INDEPENDENT_AMBULATORY_CARE_PROVIDER_SITE_OTHER): Payer: Self-pay | Admitting: Family Medicine

## 2014-08-29 VITALS — BP 134/83 | HR 50 | Temp 98.1°F | Wt 285.0 lb

## 2014-08-29 DIAGNOSIS — M79671 Pain in right foot: Secondary | ICD-10-CM

## 2014-08-29 MED ORDER — IBUPROFEN 600 MG PO TABS: 600.0000 mg | ORAL_TABLET | Freq: Three times a day (TID) | ORAL | Status: DC | PRN

## 2014-08-29 NOTE — Progress Notes (Signed)
    Subjective   Timothy Haynes is a 23 y.o. male that presents for a same day visit  1. Right foot pain: Symptoms started a week ago. No associated trauma. Pain started in ankle and radiates to medial aspect of plantar surface. Hurts when walking and standing. Feet swell up by the night time. Sleeps with legs elevated and symptoms improve. Pain worse in the evening and better in the morning. Has not taken anything for pain   History  Substance Use Topics  . Smoking status: Never Smoker   . Smokeless tobacco: Never Used  . Alcohol Use: 0.5 - 1.0 oz/week    1-2 drink(s) per week    ROS Per HPI  Objective   BP 134/83 mmHg  Pulse 50  Temp(Src) 98.1 F (36.7 C) (Oral)  Wt 285 lb (129.275 kg)  General: Well appearing male in no distress Extremities: Tenderness along medial aspect of plantar surface. No medial/lateral condyle tenderness. Plantar arch appears present bilaterally  Assessment and Plan   Please refer to problem based charting of assessment and plan

## 2014-08-29 NOTE — Patient Instructions (Signed)
Thank you for coming to see me today. It was a pleasure. Today we talked about:   Foot problems: I recommend buying an insert to help to keep the arch on your foot. This may be the reason you are having pain. I will also prescribe some ibuprofen for you to take as needed. Ice may help with your pain as well. If things do not improve in two weeks, an appointment with sports medicine may be beneficial to further evaluate your symptoms.  If you have any questions or concerns, please do not hesitate to call the office at [phone].  Sincerely,  Jacquelin Hawkingalph Vineeth Fell, MD   Flat Feet Having flat feet is a common condition. One foot or both might be affected. People of any age can have flat feet. In fact, everyone is born with them. But most of the time, the foot gradually develops an arch. That is the curve on the bottom of the foot that creates a gap between the foot and the ground. An arch usually develops in childhood. Sometimes, though, an arch never develops and the foot stays flat on the bottom. Other times, an arch develops but later collapses (caves in). That is what gives the condition its nickname, "fallen arches." The medical term for flat feet is pes planus. Some people have flat feet their whole life and have no problems. For others, the condition causes pain and needs to be corrected.  CAUSES   A problem with the foot's soft tissue; tendons and ligaments could be loose.  This can cause what is called flexible flat feet. That means the shape of the foot changes with pressure. When standing on the toes, a curved arch can be seen. When standing on the ground, the foot is flat.  Wear and tear. Sometimes arches simply flatten over time.  Damage to the posterior tibial tendon. This is the tendon that goes from the inside of the ankle to the bones in the middle of the foot. It is the main support for the arch. If the tendon is injured, stretched or torn, the arch might flatten.  Tarsal  coalition. With this condition, two or more bones in the foot are joined together (fused ) during development in the womb. This limits movement and can lead to a flat foot. SYMPTOMS   The foot is even with the ground from toe to heel. Your caregiver will look closely at the inside of the foot while you are standing.  Pain along the bottom of the foot. Some people describe the pain as tightness.  Swelling on the inside of the foot or ankle.  Changes in the way you walk (gait).  The feet lean inward, starting at the ankle (pronation). DIAGNOSIS  To decide if a child or adult has flat feet, a healthcare provider will probably:  Do a physical examination. This might include having the person stand on his or her toes and then stand normally. The caregiver will also hold the foot and put pressure on the foot in different directions.  Check the person's shoes. The pattern of wear on the soles can offer clues.  Order images (pictures) of the foot. They can help identify the cause of any pain. They also will show injuries to bones or tendons that could be causing the condition. The images can come from:  X-rays.  Computed tomography (CT) scan. This combines X-ray and a computer.  Magnetic resonance imaging (MRI). This uses magnets, radio waves and a computer to take a  picture of the foot. It is the best technique to evaluate tendons, ligaments and muscles. TREATMENT   Flexible flat feet usually are painless. Most of the time, gait is not affected. Most children grow out of the condition. Often no treatment is needed. If there is pain, treatment options include:  Orthotics. These are inserts that go in the shoes. They add support and shape to the feet. An orthotic is custom-made from a mold of the foot.  Shoes. Not all shoes are the same. People with flat feet need arch support. However, too much can be painful. It is important to find shoes that offer the right amount of support. Athletes,  especially runners, may need to try shoes made just for people with flatter feet.  Medication. For pain, only take over-the-counter medicine for pain, discomfort, as directed by your caregiver.  Rest. If the feet start to hurt, cut back on the exercise which increases the pain. Use common sense.  For damage to the posterior tibial tendon, options include:  Orthotics. Also adding a wedge on the inside edge may help. This can relieve pressure on the tendon.  Ankle brace, boot or cast. These supports can ease the load on the tendon while it heals.  Surgery. If the tendon is torn, it might need to be repaired.  For tarsal coalition, similar options apply:  Pain medication.  Orthotics.  A cast and crutches. This keeps weight off the foot.  Physical therapy.  Surgery to remove the bone bridge joining the two bones together. PROGNOSIS  In most people, flat feet do not cause pain or problems. People can go about their normal activities. However, if flat feet are painful, they can and should be treated. Treatment usually relieves the pain. HOME CARE INSTRUCTIONS   Take any medications prescribed by the healthcare provider. Follow the directions carefully.  Wear, or make sure a child wears, orthotics or special shoes if this was suggested. Be sure to ask how often and for how long they should be worn.  Do any exercises or therapy treatments that were suggested.  Take notes on when the pain occurs. This will help healthcare providers decide how to treat the condition.  If surgery is needed, be sure to find out if there is anything that should or should not be done before the operation. SEEK MEDICAL CARE IF:   Pain worsens in the foot or lower leg.  Pain disappears after treatment, but then returns.  Walking or simple exercise becomes difficult or causes foot pain.  Orthotics or special shoes are uncomfortable or painful. Document Released: 06/27/2009 Document Revised: 11/22/2011  Document Reviewed: 06/27/2009 [facility] Patient Information 2015 [employer]. This information is not intended to replace advice given to you by your health care provider. Make sure you discuss any questions you have with your health care provider.

## 2014-09-08 DIAGNOSIS — M79671 Pain in right foot: Secondary | ICD-10-CM | POA: Insufficient documentation

## 2014-09-08 NOTE — Assessment & Plan Note (Addendum)
Symptoms consistent with flat feet. No history of trauma to think there is a fracture  Recommended buying insoles  Ibuprofen 600mg  for pain PRN #30  Recommended follow-up if symptoms do not improve or worsen

## 2016-02-25 ENCOUNTER — Encounter: Payer: Medicaid Other | Admitting: Family Medicine

## 2016-06-06 ENCOUNTER — Encounter (HOSPITAL_COMMUNITY): Payer: Self-pay

## 2016-06-06 ENCOUNTER — Emergency Department (HOSPITAL_COMMUNITY): Payer: No Typology Code available for payment source

## 2016-06-06 ENCOUNTER — Emergency Department (HOSPITAL_COMMUNITY)
Admission: EM | Admit: 2016-06-06 | Discharge: 2016-06-06 | Disposition: A | Discharge status: Home / Self Care | Payer: No Typology Code available for payment source | Attending: Emergency Medicine | Admitting: Emergency Medicine

## 2016-06-06 DIAGNOSIS — S41012A Laceration without foreign body of left shoulder, initial encounter: Secondary | ICD-10-CM | POA: Diagnosis not present

## 2016-06-06 DIAGNOSIS — R109 Unspecified abdominal pain: Secondary | ICD-10-CM | POA: Diagnosis not present

## 2016-06-06 DIAGNOSIS — R079 Chest pain, unspecified: Secondary | ICD-10-CM | POA: Diagnosis not present

## 2016-06-06 DIAGNOSIS — R51 Headache: Secondary | ICD-10-CM | POA: Insufficient documentation

## 2016-06-06 DIAGNOSIS — M7918 Myalgia, other site: Secondary | ICD-10-CM

## 2016-06-06 DIAGNOSIS — Y999 Unspecified external cause status: Secondary | ICD-10-CM | POA: Insufficient documentation

## 2016-06-06 DIAGNOSIS — T148XXA Other injury of unspecified body region, initial encounter: Secondary | ICD-10-CM

## 2016-06-06 DIAGNOSIS — Y939 Activity, unspecified: Secondary | ICD-10-CM | POA: Insufficient documentation

## 2016-06-06 DIAGNOSIS — Y92410 Unspecified street and highway as the place of occurrence of the external cause: Secondary | ICD-10-CM | POA: Diagnosis not present

## 2016-06-06 DIAGNOSIS — S4992XA Unspecified injury of left shoulder and upper arm, initial encounter: Secondary | ICD-10-CM | POA: Diagnosis present

## 2016-06-06 DIAGNOSIS — M549 Dorsalgia, unspecified: Secondary | ICD-10-CM | POA: Insufficient documentation

## 2016-06-06 LAB — CBC
HCT: 44.9 % (ref 39.0–52.0)
Hemoglobin: 15.3 g/dL (ref 13.0–17.0)
MCH: 30.8 pg (ref 26.0–34.0)
MCHC: 34.1 g/dL (ref 30.0–36.0)
MCV: 90.5 fL (ref 78.0–100.0)
PLATELETS: 199 10*3/uL (ref 150–400)
RBC: 4.96 MIL/uL (ref 4.22–5.81)
RDW: 13.6 % (ref 11.5–15.5)
WBC: 11.5 10*3/uL — AB (ref 4.0–10.5)

## 2016-06-06 LAB — COMPREHENSIVE METABOLIC PANEL
ALT: 21 U/L (ref 17–63)
AST: 21 U/L (ref 15–41)
Albumin: 3.7 g/dL (ref 3.5–5.0)
Alkaline Phosphatase: 51 U/L (ref 38–126)
Anion gap: 9 (ref 5–15)
BILIRUBIN TOTAL: 0.2 mg/dL — AB (ref 0.3–1.2)
BUN: 9 mg/dL (ref 6–20)
CO2: 21 mmol/L — ABNORMAL LOW (ref 22–32)
CREATININE: 1 mg/dL (ref 0.61–1.24)
Calcium: 8.9 mg/dL (ref 8.9–10.3)
Chloride: 107 mmol/L (ref 101–111)
GFR calc Af Amer: >60 mL/min (ref >60)
Glucose, Bld: 146 mg/dL — ABNORMAL HIGH (ref 65–99)
POTASSIUM: 3.5 mmol/L (ref 3.5–5.1)
Sodium: 137 mmol/L (ref 135–145)
TOTAL PROTEIN: 6 g/dL — AB (ref 6.5–8.1)

## 2016-06-06 LAB — I-STAT CHEM 8, ED
BUN: 10 mg/dL (ref 6–20)
CREATININE: 0.9 mg/dL (ref 0.61–1.24)
Calcium, Ion: 1.19 mmol/L (ref 1.15–1.40)
Chloride: 107 mmol/L (ref 101–111)
Glucose, Bld: 145 mg/dL — ABNORMAL HIGH (ref 65–99)
HEMATOCRIT: 46 % (ref 39.0–52.0)
Hemoglobin: 15.6 g/dL (ref 13.0–17.0)
Potassium: 3.5 mmol/L (ref 3.5–5.1)
Sodium: 144 mmol/L (ref 135–145)
TCO2: 25 mmol/L (ref 0–100)

## 2016-06-06 MED ORDER — IOPAMIDOL (ISOVUE-300) INJECTION 61%
INTRAVENOUS | Status: AC
  Administered 2016-06-06: 100 mL
  Filled 2016-06-06: qty 100

## 2016-06-06 MED ORDER — SODIUM CHLORIDE 0.9 % IV BOLUS (SEPSIS)
1000.0000 mL | Freq: Once | INTRAVENOUS | Status: AC
  Administered 2016-06-06: 1000 mL via INTRAVENOUS

## 2016-06-06 NOTE — ED Notes (Signed)
Patient is alert and orientedx4.  Patient was explained discharge instructions and they understood them with no questions.  The patient's mother, is taking the patient home.

## 2016-06-06 NOTE — ED Notes (Addendum)
Pt remains alert and oriented. States back pain improved but left shoulder 8/10. Radial pulse stron and 82.
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
## 2016-06-06 NOTE — ED Notes (Signed)
Patient transported to CT 

## 2016-06-06 NOTE — ED Notes (Signed)
Pt returns from ct  
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
## 2016-06-09 ENCOUNTER — Ambulatory Visit: Payer: Medicaid Other | Admitting: Family Medicine

## 2018-02-01 ENCOUNTER — Encounter: Payer: Self-pay | Admitting: Family Medicine

## 2018-02-09 ENCOUNTER — Encounter: Payer: Self-pay | Admitting: Family Medicine

## 2018-07-13 ENCOUNTER — Encounter: Payer: Medicaid Other | Admitting: Family Medicine

## 2018-10-23 ENCOUNTER — Encounter (HOSPITAL_COMMUNITY): Payer: Self-pay | Admitting: Emergency Medicine

## 2018-10-23 ENCOUNTER — Ambulatory Visit (HOSPITAL_COMMUNITY)
Admission: EM | Admit: 2018-10-23 | Discharge: 2018-10-23 | Disposition: A | Discharge status: Home / Self Care | Payer: Medicaid Other | Attending: Urgent Care | Admitting: Urgent Care

## 2018-10-23 ENCOUNTER — Ambulatory Visit (INDEPENDENT_AMBULATORY_CARE_PROVIDER_SITE_OTHER): Payer: Medicaid Other

## 2018-10-23 DIAGNOSIS — R0789 Other chest pain: Secondary | ICD-10-CM

## 2018-10-23 DIAGNOSIS — R9431 Abnormal electrocardiogram [ECG] [EKG]: Secondary | ICD-10-CM

## 2018-10-23 DIAGNOSIS — R079 Chest pain, unspecified: Secondary | ICD-10-CM

## 2018-10-23 DIAGNOSIS — R05 Cough: Secondary | ICD-10-CM | POA: Diagnosis not present

## 2018-10-23 DIAGNOSIS — F17200 Nicotine dependence, unspecified, uncomplicated: Secondary | ICD-10-CM

## 2018-10-23 DIAGNOSIS — R69 Illness, unspecified: Secondary | ICD-10-CM

## 2018-10-23 DIAGNOSIS — R61 Generalized hyperhidrosis: Secondary | ICD-10-CM

## 2018-10-23 DIAGNOSIS — R059 Cough, unspecified: Secondary | ICD-10-CM

## 2018-10-23 DIAGNOSIS — J111 Influenza due to unidentified influenza virus with other respiratory manifestations: Secondary | ICD-10-CM

## 2018-10-23 MED ORDER — BENZONATATE 100 MG PO CAPS: 100.0000 mg | ORAL_CAPSULE | Freq: Three times a day (TID) | ORAL | 0 refills | Status: DC | PRN

## 2018-10-23 MED ORDER — PROMETHAZINE-DM 6.25-15 MG/5ML PO SYRP: 5.0000 mL | ORAL_SOLUTION | Freq: Three times a day (TID) | ORAL | 0 refills | Status: DC | PRN

## 2018-10-23 MED ORDER — OSELTAMIVIR PHOSPHATE 75 MG PO CAPS: 75.0000 mg | ORAL_CAPSULE | Freq: Two times a day (BID) | ORAL | 0 refills | Status: DC

## 2018-10-23 NOTE — ED Triage Notes (Signed)
Pt states he woke up this morning with a cough and body aches.

## 2018-10-23 NOTE — Discharge Instructions (Addendum)
We will manage this as a viral syndrome. For sore throat or cough try using a honey-based tea. Use 3 teaspoons of honey with juice squeezed from half lemon. Place shaved pieces of ginger into 1/2-1 cup of water and warm over stove top. Then mix the ingredients and repeat every 4 hours as needed. Please take ibuprofen 400mg  every 6 hours alternating with OR taken together with Tylenol 500mg  every 6 hours. Hydrate very well with at least 2 liters of water. Eat light meals such as soups to replenish electrolytes and soft fruits, veggies. Start an antihistamine like Zyrtec, Allegra or Claritin for nasal congestion, post-nasal drainage. You may pick up over-the-counter pseudoephedrine (Sudafed) and use this for post-nasal drainage, sinus congestion at a dose of 60mg  every 8 hours as needed for the same.

## 2018-10-23 NOTE — ED Provider Notes (Addendum)
MRN: [id number] DOB: 05-07-1991  Subjective:   Timothy Haynes is a 28 y.o. male presenting for acute onset of chest pain this morning rated 8 out of 10, pierces anteriorly midsternum to his back.  Has also had a productive cough.  Has tried over-the-counter medications with minimal relief.  Denies taking any chronic medications.   No Known Allergies  History reviewed. No pertinent past medical history.   Past Surgical History:  Procedure Laterality Date  . FOOT SURGERY     Review of Systems  Constitutional: Positive for fever and malaise/fatigue.  HENT: Positive for congestion and sore throat. Negative for ear pain and sinus pain.   Eyes: Negative for blurred vision, double vision, discharge and redness.  Respiratory: Positive for cough. Negative for hemoptysis, shortness of breath and wheezing.   Cardiovascular: Negative for chest pain.  Gastrointestinal: Negative for abdominal pain, diarrhea, nausea and vomiting.  Genitourinary: Negative for dysuria, flank pain and hematuria.  Musculoskeletal: Positive for myalgias.  Skin: Negative for rash.  Neurological: Positive for headaches. Negative for weakness.  Psychiatric/Behavioral: Negative for depression and substance abuse.   Family History  Problem Relation Age of Onset  . Hypertension Mother   . Hypertension Father   . Kidney disease Father   . Diabetes Maternal Grandmother    Objective:   Vitals: BP 128/78   Pulse 92   Temp 99.3 F (37.4 C)   Resp 18   SpO2 96%   Physical Exam Constitutional:      General: He is not in acute distress.    Appearance: Normal appearance. He is well-developed. He is not ill-appearing, toxic-appearing or diaphoretic.  HENT:     Head: Normocephalic and atraumatic.     Right Ear: External ear normal.     Left Ear: External ear normal.     Nose: Nose normal.     Mouth/Throat:     Mouth: Mucous membranes are moist.     Pharynx: Oropharynx is clear.  Eyes:     General: No scleral  icterus.    Extraocular Movements: Extraocular movements intact.     Pupils: Pupils are equal, round, and reactive to light.  Cardiovascular:     Rate and Rhythm: Normal rate and regular rhythm.     Heart sounds: Normal heart sounds. No murmur. No friction rub. No gallop.   Pulmonary:     Effort: Pulmonary effort is normal. No respiratory distress.     Breath sounds: Normal breath sounds. No stridor. No wheezing, rhonchi or rales.  Neurological:     Mental Status: He is alert and oriented to person, place, and time.  Psychiatric:        Mood and Affect: Mood normal.        Behavior: Behavior normal.        Thought Content: Thought content normal.    Dg Chest 2 View  Result Date: 10/23/2018 CLINICAL DATA:  Cough and chest pain EXAM: CHEST - 2 VIEW COMPARISON:  06/06/2016 FINDINGS: The heart size and mediastinal contours are within normal limits. Both lungs are clear. The visualized skeletal structures are unremarkable. IMPRESSION: No active cardiopulmonary disease. Electronically Signed   By: Jasmine Pang M.D.   On: 10/23/2018 17:56   ED ECG REPORT   Date: 10/23/2018  Rate: 77bpm  Rhythm: sinus arrhythmia  QRS Axis: indeterminate  Intervals: normal  ST/T Wave abnormalities: nonspecific ST/T changes  Conduction Disutrbances:none  Narrative Interpretation: Nonspecific R, R prime in lead III, inverted T waves in lead III,  aVF, T wave flattening in lead V6.  Nonspecific sinus arrhythmia at 77 bpm largely unchanged from ECG dated 2017.  Old EKG Reviewed: unchanged  I have personally reviewed the EKG tracing and agree with the computerized printout as noted.  Assessment and Plan :   Influenza-like illness  Diaphoresis  Cough  Atypical chest pain  Nonspecific abnormal electrocardiogram (ECG) (EKG)  Tobacco use disorder  Will cover for influenza with Tamiflu given symptom set, acute onset, physical exam findings.  Use supportive care, rest, fluids, hydration, light meals,  schedule Tylenol and ibuprofen. Counseled patient on potential for adverse effects with medications prescribed today, patient verbalized understanding. ER and return-to-clinic precautions discussed, patient verbalized understanding.  Emphasized need for cardiac consult to his PCP.  Recommended smoking cessation.   Wallis Bamberg, PA-C 10/23/18 1807    Wallis Bamberg, PA-C 10/23/18 [phone]

## 2018-10-25 ENCOUNTER — Other Ambulatory Visit: Payer: Self-pay

## 2018-10-25 ENCOUNTER — Ambulatory Visit (INDEPENDENT_AMBULATORY_CARE_PROVIDER_SITE_OTHER): Payer: Medicaid Other | Admitting: Family Medicine

## 2018-10-25 ENCOUNTER — Encounter: Payer: Self-pay | Admitting: Family Medicine

## 2018-10-25 DIAGNOSIS — J111 Influenza due to unidentified influenza virus with other respiratory manifestations: Secondary | ICD-10-CM | POA: Insufficient documentation

## 2018-10-25 DIAGNOSIS — R69 Illness, unspecified: Secondary | ICD-10-CM

## 2018-10-25 DIAGNOSIS — R0789 Other chest pain: Secondary | ICD-10-CM | POA: Insufficient documentation

## 2018-10-25 NOTE — Patient Instructions (Signed)
I am not worried about the heart or EKG.  You seem to have a normal, healthy heart. I think you have musculoskeletal chest pain from coughing. You should get completely over this in the next few days. If the chest pain does not go away after 2-3 weeks, see us again.

## 2018-10-25 NOTE — Assessment & Plan Note (Signed)
I am not concerned about chest pain or sinus arrythmia.  No evidence of concern.  All easily related to his ILI.

## 2018-10-25 NOTE — Assessment & Plan Note (Signed)
By clinical exam and hx, not concerning.

## 2018-10-25 NOTE — Progress Notes (Signed)
Established Patient Office Visit  Subjective:  Patient ID: Timothy Haynes, male    DOB: 11/22/1990  Age: 28 y.o. MRN: [id number]  CC:  Chief Complaint  Patient presents with  . Irregular Heart Beat    HPI Timothy Haynes presents for Urgent care follow up.  Seen in Urgent care with an ILI.  Also found to have an irregular heart beat and complained of chest pain.  Reviewed Urgent care note, EKG and labs.  Patient states he is improving with less chest pain.  No SOB.  No travel.  Still feels bad.  Pain is described as fleeting, lasting seconds to minutes, sharp and associated with cough and movements.  Denies trauma or prolonged immobilization (risk factors for PE).  Not immunocompromised and no previous cardiac history.  Smoker.  Denies alcohol or drug use.   History reviewed. No pertinent past medical history.  Past Surgical History:  Procedure Laterality Date  . FOOT SURGERY      Family History  Problem Relation Age of Onset  . Hypertension Mother   . Hypertension Father   . Kidney disease Father   . Diabetes Maternal Grandmother     Social History   Socioeconomic History  . Marital status: Single    Spouse name: Not on file  . Number of children: Not on file  . Years of education: Not on file  . Highest education level: Not on file  Occupational History  . Not on file  Social Needs  . Financial resource strain: Not on file  . Food insecurity:    Worry: Not on file    Inability: Not on file  . Transportation needs:    Medical: Not on file    Non-medical: Not on file  Tobacco Use  . Smoking status: Current Every Day Smoker    Types: Cigarettes  . Smokeless tobacco: Never Used  Substance and Sexual Activity  . Alcohol use: Yes    Alcohol/week: 1.0 - 2.0 standard drinks    Types: 1 - 2 Standard drinks or equivalent per week  . Drug use: No  . Sexual activity: Yes    Comment: married and monagomous  Lifestyle  . Physical activity:    Days per week: Not on  file    Minutes per session: Not on file  . Stress: Not on file  Relationships  . Social connections:    Talks on phone: Not on file    Gets together: Not on file    Attends religious service: Not on file    Active member of club or organization: Not on file    Attends meetings of clubs or organizations: Not on file    Relationship status: Not on file  . Intimate partner violence:    Fear of current or ex partner: Not on file    Emotionally abused: Not on file    Physically abused: Not on file    Forced sexual activity: Not on file  Other Topics Concern  . Not on file  Social History Narrative  . Not on file    Outpatient Medications Prior to Visit  Medication Sig Dispense Refill  . benzonatate (TESSALON) 100 MG capsule Take 1-2 capsules (100-200 mg total) by mouth 3 (three) times daily as needed. 60 capsule 0  . oseltamivir (TAMIFLU) 75 MG capsule Take 1 capsule (75 mg total) by mouth 2 (two) times daily. 10 capsule 0  . promethazine-dextromethorphan (PROMETHAZINE-DM) 6.25-15 MG/5ML syrup Take 5 mLs by mouth 3 (  three) times daily as needed for cough. 100 mL 0   No facility-administered medications prior to visit.     No Known Allergies  ROS Review of Systems    Objective:    Physical Exam  BP 128/86   Pulse 60   Temp 98.2 F (36.8 C) (Oral)   Wt 278 lb 6.4 oz (126.3 kg)   SpO2 98%   BMI 44.93 kg/m  Wt Readings from Last 3 Encounters:  10/25/18 278 lb 6.4 oz (126.3 kg)  06/06/16 220 lb (99.8 kg)  08/29/14 285 lb (129.3 kg)  Lungs clear.  Cardiac RRR without m or g Abd benign Does have lower left anterior sternal costo tenderness to palpation.  Health Maintenance Due  Topic Date Due  . HIV Screening  04/19/2005  . INFLUENZA VACCINE  04/13/2018    There are no preventive care reminders to display for this patient.  Lab Results  Component Value Date   TSH 3.444 02/06/2010   Lab Results  Component Value Date   WBC 11.5 (H) 06/06/2016   HGB 15.6  06/06/2016   HCT 46.0 06/06/2016   MCV 90.5 06/06/2016   PLT 199 06/06/2016   Lab Results  Component Value Date   NA 144 06/06/2016   K 3.5 06/06/2016   CO2 21 (L) 06/06/2016   GLUCOSE 145 (H) 06/06/2016   BUN 10 06/06/2016   CREATININE 0.90 06/06/2016   BILITOT 0.2 (L) 06/06/2016   ALKPHOS 51 06/06/2016   AST 21 06/06/2016   ALT 21 06/06/2016   PROT 6.0 (L) 06/06/2016   ALBUMIN 3.7 06/06/2016   CALCIUM 8.9 06/06/2016   ANIONGAP 9 06/06/2016   Lab Results  Component Value Date   CHOL 124 02/06/2010   Lab Results  Component Value Date   HDL 34 (L) 02/06/2010   Lab Results  Component Value Date   LDLCALC 81 02/06/2010   Lab Results  Component Value Date   TRIG 45 02/06/2010   Lab Results  Component Value Date   CHOLHDL 3.6 Ratio 02/06/2010   No results found for: HGBA1C    Assessment & Plan:   Problem List Items Addressed This Visit    Influenza-like illness   Chest wall pain      No orders of the defined types were placed in this encounter.   Follow-up: No follow-ups on file.    Moses Manners, MD

## 2019-03-23 IMAGING — DX DG CHEST 2V
2 series · 2 of 2 positions shown · non-contrast
Comparison: 06/06/2016

CLINICAL DATA: Cough and chest pain

EXAM:
CHEST - 2 VIEW

[chest pa]
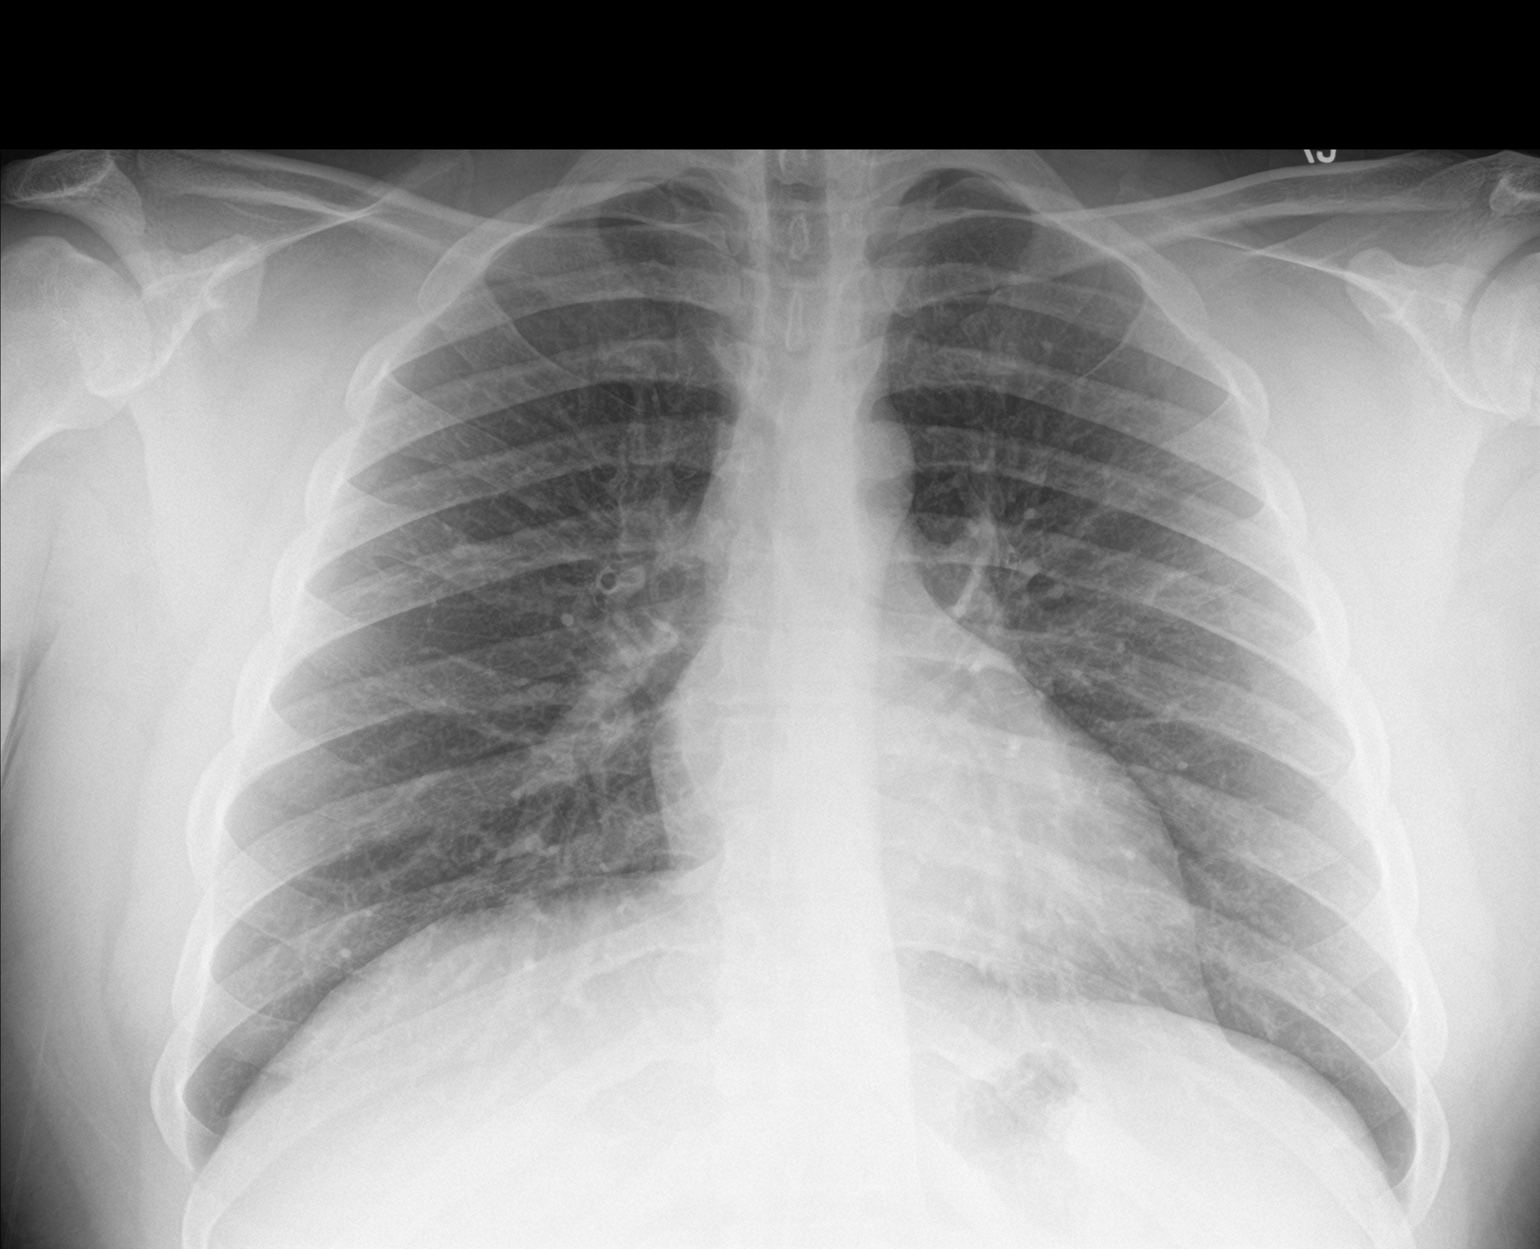

[chest lat]
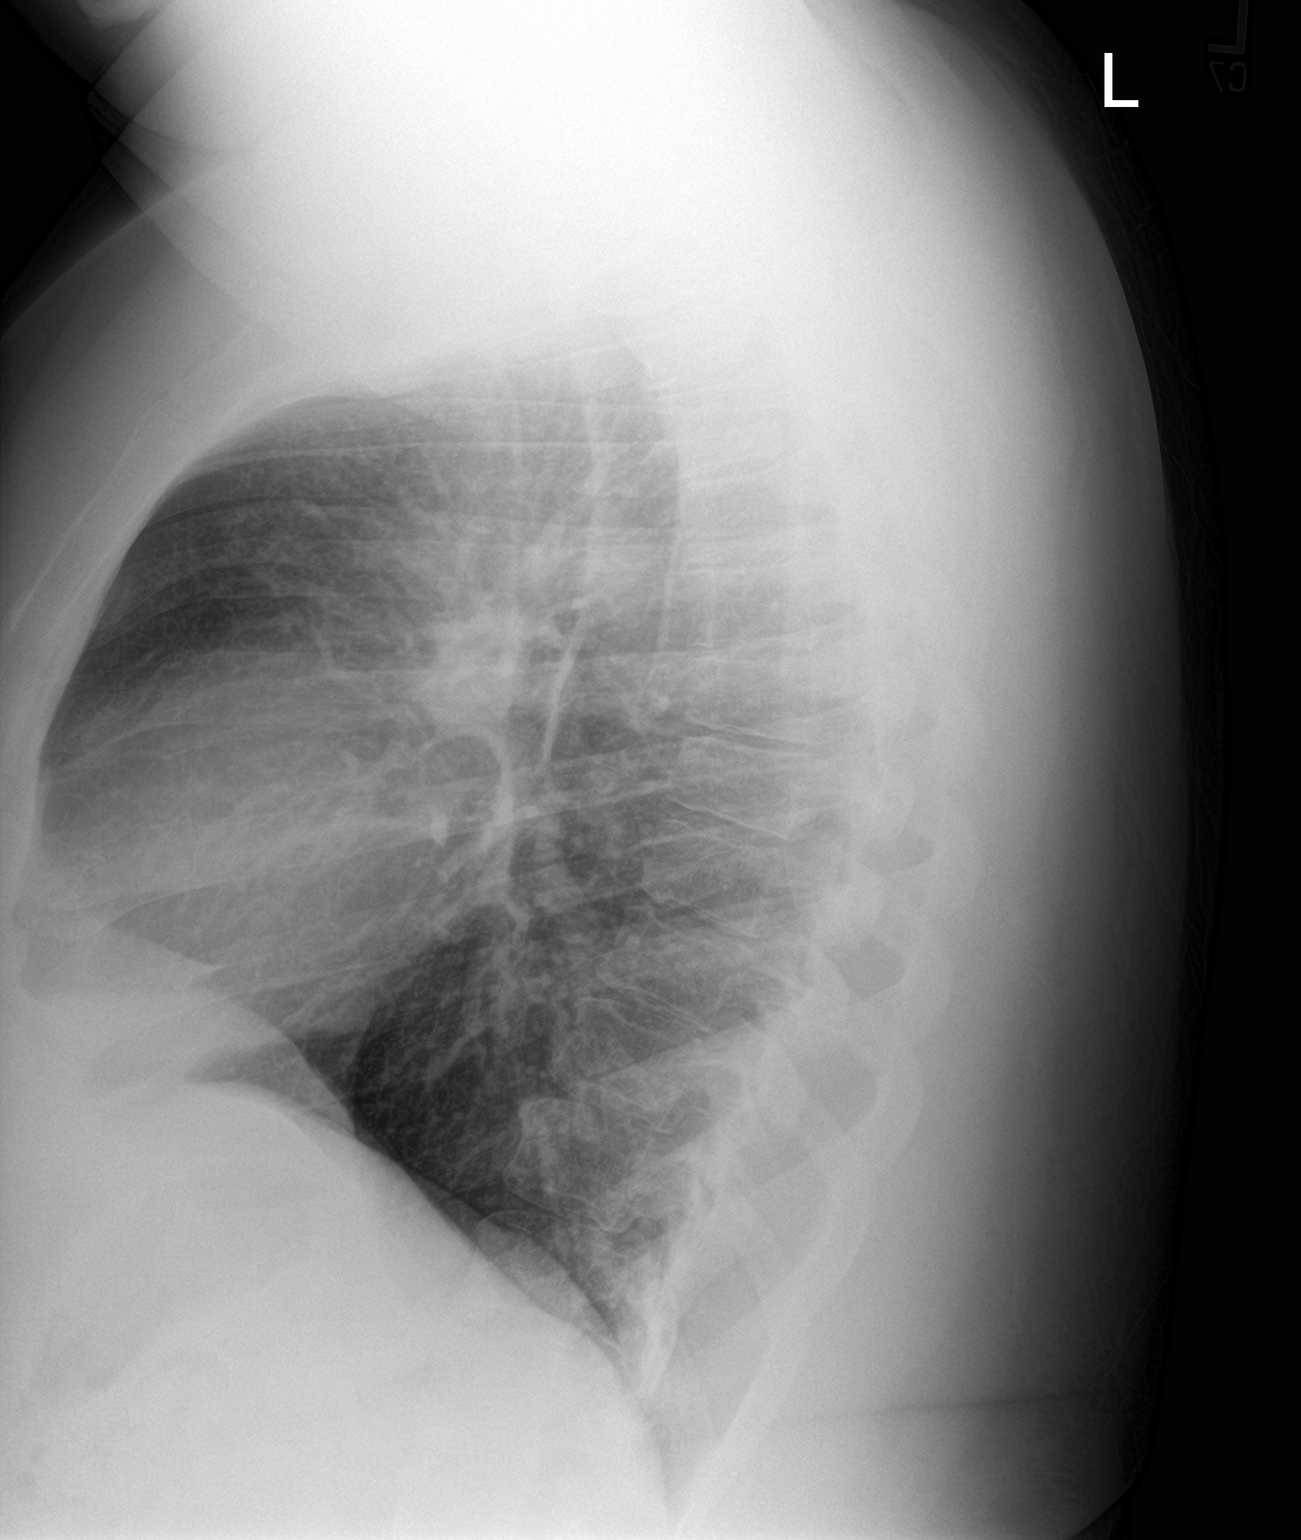

[2 of 2 positions shown; findings below may reference images not displayed]

FINDINGS: The heart size and mediastinal contours are within normal limits.
Both lungs are clear. The visualized skeletal structures are
unremarkable.
IMPRESSION: No active cardiopulmonary disease.

## 2019-05-09 ENCOUNTER — Encounter: Payer: Medicaid Other | Admitting: Family Medicine

## 2020-01-24 ENCOUNTER — Ambulatory Visit (INDEPENDENT_AMBULATORY_CARE_PROVIDER_SITE_OTHER): Payer: Medicaid Other

## 2020-01-24 ENCOUNTER — Ambulatory Visit (HOSPITAL_COMMUNITY)
Admission: EM | Admit: 2020-01-24 | Discharge: 2020-01-24 | Disposition: A | Discharge status: Home / Self Care | Payer: Medicaid Other | Attending: Urgent Care | Admitting: Urgent Care

## 2020-01-24 ENCOUNTER — Other Ambulatory Visit: Payer: Self-pay

## 2020-01-24 ENCOUNTER — Encounter (HOSPITAL_COMMUNITY): Payer: Self-pay

## 2020-01-24 DIAGNOSIS — Z23 Encounter for immunization: Secondary | ICD-10-CM

## 2020-01-24 DIAGNOSIS — M79645 Pain in left finger(s): Secondary | ICD-10-CM

## 2020-01-24 DIAGNOSIS — S62525B Nondisplaced fracture of distal phalanx of left thumb, initial encounter for open fracture: Secondary | ICD-10-CM | POA: Diagnosis not present

## 2020-01-24 DIAGNOSIS — S61012A Laceration without foreign body of left thumb without damage to nail, initial encounter: Secondary | ICD-10-CM | POA: Diagnosis not present

## 2020-01-24 MED ORDER — ACETAMINOPHEN 325 MG PO TABS
ORAL_TABLET | ORAL | Status: AC
  Filled 2020-01-24: qty 2

## 2020-01-24 MED ORDER — TETANUS-DIPHTH-ACELL PERTUSSIS 5-2.5-18.5 LF-MCG/0.5 IM SUSP
0.5000 mL | Freq: Once | INTRAMUSCULAR | Status: AC
  Administered 2020-01-24: 0.5 mL via INTRAMUSCULAR

## 2020-01-24 MED ORDER — CEPHALEXIN 500 MG PO CAPS: 500.0000 mg | ORAL_CAPSULE | Freq: Three times a day (TID) | ORAL | 0 refills | Status: DC

## 2020-01-24 MED ORDER — HYDROCODONE-ACETAMINOPHEN 5-325 MG PO TABS: 1.0000 | ORAL_TABLET | Freq: Four times a day (QID) | ORAL | 0 refills | Status: DC | PRN

## 2020-01-24 MED ORDER — NAPROXEN 500 MG PO TABS: 500.0000 mg | ORAL_TABLET | Freq: Two times a day (BID) | ORAL | 0 refills | Status: DC

## 2020-01-24 MED ORDER — TETANUS-DIPHTH-ACELL PERTUSSIS 5-2.5-18.5 LF-MCG/0.5 IM SUSP
INTRAMUSCULAR | Status: AC
  Filled 2020-01-24: qty 0.5

## 2020-01-24 MED ORDER — ACETAMINOPHEN 325 MG PO TABS
650.0000 mg | ORAL_TABLET | Freq: Once | ORAL | Status: AC
  Administered 2020-01-24: 650 mg via ORAL

## 2020-01-24 NOTE — Discharge Instructions (Addendum)
Get your naproxen twice daily on a regular basis for pain control.  You can use hydrocodone if you still have pain despite taking naproxen.  Once your pain is better controlled, then switch back to just naproxen. Remove your dressing in 24 hours. Reapply a dressing if you are going to be active with your hands. It is ok to place a little piece of gauze over the wound, then apply the finger splint. Wear the finger splint for the next 2 weeks. Contact the hand specialist for a consult.

## 2020-01-24 NOTE — ED Triage Notes (Signed)
Pt states was cutting wood with a table saw and the wood jumped up and made the left 1st digit. The top layer of skin is peeled back on thumb. Bleeding is controlled.

## 2020-01-24 NOTE — ED Provider Notes (Addendum)
[place]   MRN: [id number] DOB: 08/28/1991  Subjective:   Timothy Haynes is a 29 y.o. male presenting for suffering an injury while using a table saw today while at work.  Patient was passing with through when it jumped up and made impact with his left thumb.  He felt immediate pain, had persistent bleeding.  He took ibuprofen and came straight here.  His last Tdap was about 10 or more years ago.   Current Facility-Administered Medications:  .  acetaminophen (TYLENOL) tablet 650 mg, 650 mg, Oral, Once, Jaynee Eagles, PA-C .  Tdap (BOOSTRIX) injection 0.5 mL, 0.5 mL, Intramuscular, Once, Jaynee Eagles, PA-C No current outpatient medications on file.   No Known Allergies  No past medical history on file.   Past Surgical History:  Procedure Laterality Date  . FOOT SURGERY      Family History  Problem Relation Age of Onset  . Hypertension Mother   . Hypertension Father   . Kidney disease Father   . Diabetes Maternal Grandmother     Social History   Tobacco Use  . Smoking status: Current Every Day Smoker    Types: Cigarettes  . Smokeless tobacco: Never Used  Substance Use Topics  . Alcohol use: Yes    Alcohol/week: 1.0 - 2.0 standard drinks    Types: 1 - 2 Standard drinks or equivalent per week  . Drug use: No    ROS   Objective:   Vitals: BP (!) 158/103   Pulse 61   Temp 98.1 F (36.7 C) (Oral)   Resp 18   Ht 5\' 5"  (1.651 m)   Wt 250 lb (113.4 kg)   SpO2 100%   BMI 41.60 kg/m   Physical Exam Constitutional:      General: He is not in acute distress.    Appearance: Normal appearance. He is well-developed. He is obese. He is not ill-appearing, toxic-appearing or diaphoretic.  HENT:     Head: Normocephalic and atraumatic.     Right Ear: External ear normal.     Left Ear: External ear normal.     Nose: Nose normal.     Mouth/Throat:     Pharynx: Oropharynx is clear.  Eyes:     General: No scleral icterus.       Right eye: No discharge.       Left eye: No discharge.     Extraocular Movements: Extraocular movements intact.     Pupils: Pupils are equal, round, and reactive to light.  Cardiovascular:     Rate and Rhythm: Normal rate.  Pulmonary:     Effort: Pulmonary effort is normal.  Musculoskeletal:       Hands:     Cervical back: Normal range of motion.  Neurological:     Mental Status: He is alert and oriented to person, place, and time.  Psychiatric:        Mood and Affect: Mood normal.        Behavior: Behavior normal.        Thought Content: Thought content normal.        Judgment: Judgment normal.     Tdap updated in clinic.   DG Finger Thumb Left  Result Date: 01/24/2020 CLINICAL DATA:  Injury with saw EXAM: LEFT THUMB 2+V COMPARISON:  None. FINDINGS: Frontal, oblique, and lateral views were obtained. There is an obliquely oriented fracture of the proximal to mid aspects of the first distal phalanx with alignment overall near anatomic. No other fracture.  No dislocation. Overlying bandage. No other radiopaque foreign body. IMPRESSION: Obliquely oriented fracture involving the proximal to mid aspects of the first distal phalanx with alignment near anatomic. No other fracture. No dislocation. No appreciable arthropathy. Overlying bandage. These results will be called to the ordering clinician or representative by the Radiologist Assistant, and communication documented in the PACS or [employer]. Electronically Signed   By: Bretta Bang III M.D.   On: 01/24/2020 14:38   Wound care: Digital block performed using 2% lidocaine without epinephrine.  Wound cleansed with soaking using soap and water.  This revealed on avulsion laceration involving the outermost layer of skin.  This piece was laid over as a graft for his wound.  Nonadherent dressing applied and secured with Coban.  Splint applied to the distal portion of left thumb.  Assessment and Plan :   PDMP not reviewed this encounter.  1. Thumb pain, left     2. Laceration of left thumb without damage to nail, foreign body presence unspecified, initial encounter   3. Need for Tdap vaccination   4. Open nondisplaced fracture of distal phalanx of left thumb, initial encounter     Start Keflex given open fracture.  Wound care performed, static finger splint applied.  Use naproxen for pain and inflammation, hydrocodone for breakthrough pain.  Contact hand specialist for consult given extent of thumb fracture. Counseled patient on potential for adverse effects with medications prescribed/recommended today, ER and return-to-clinic precautions discussed, patient verbalized understanding.   Wallis Bamberg, [place] 01/24/20 1523

## 2020-02-13 DIAGNOSIS — S62525A Nondisplaced fracture of distal phalanx of left thumb, initial encounter for closed fracture: Secondary | ICD-10-CM | POA: Insufficient documentation

## 2020-02-13 DIAGNOSIS — S61012A Laceration without foreign body of left thumb without damage to nail, initial encounter: Secondary | ICD-10-CM | POA: Insufficient documentation

## 2020-06-23 IMAGING — DX DG FINGER THUMB 2+V*L*
3 series · 3 of 3 positions shown · non-contrast
Comparison: None.

CLINICAL DATA: Injury with saw

EXAM:
LEFT THUMB 2+V

[finger ap]
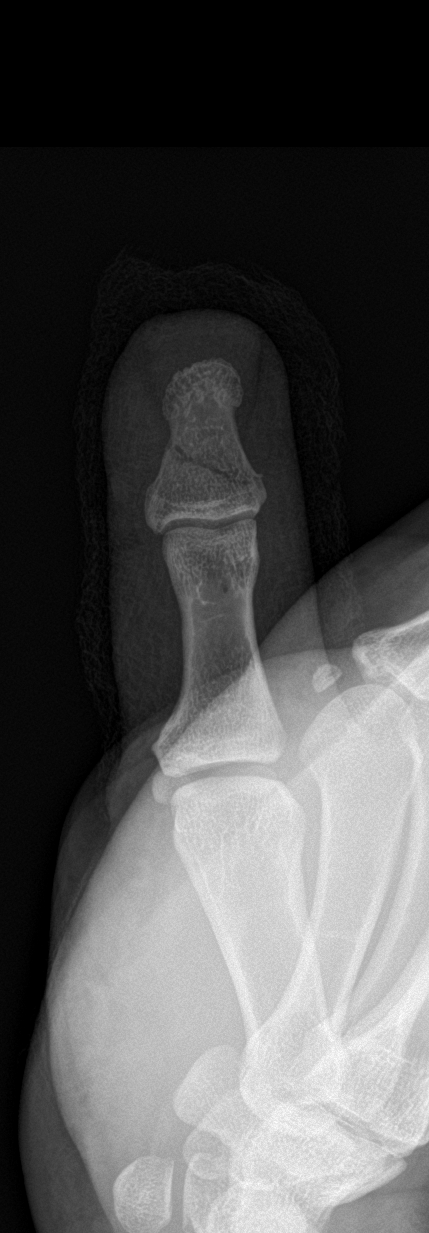

[finger obl]
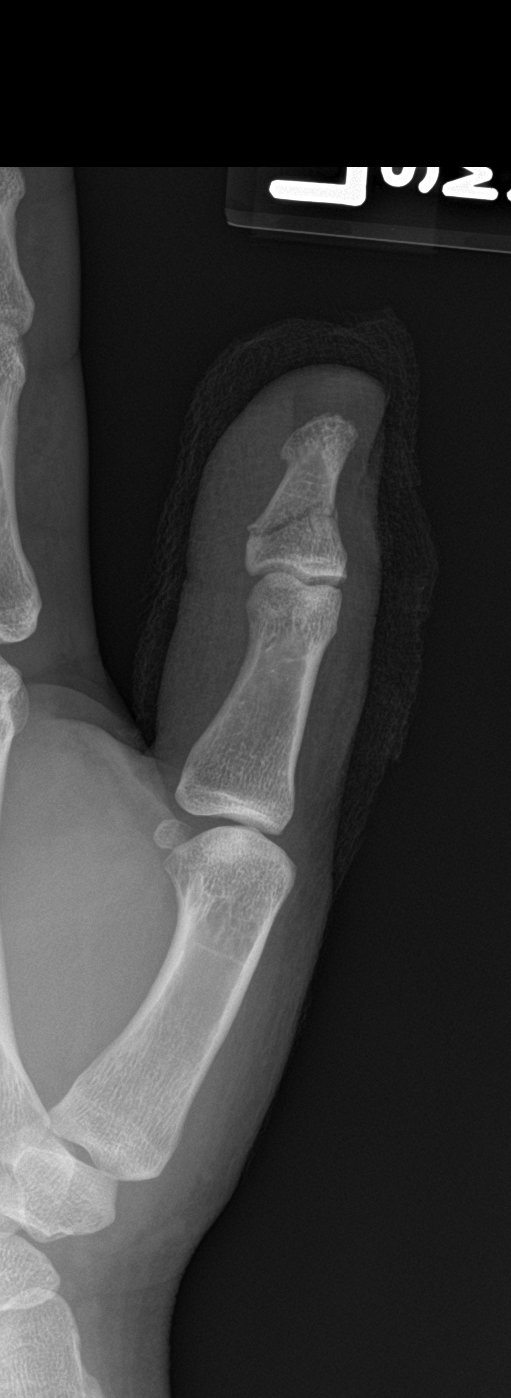

[finger lat]
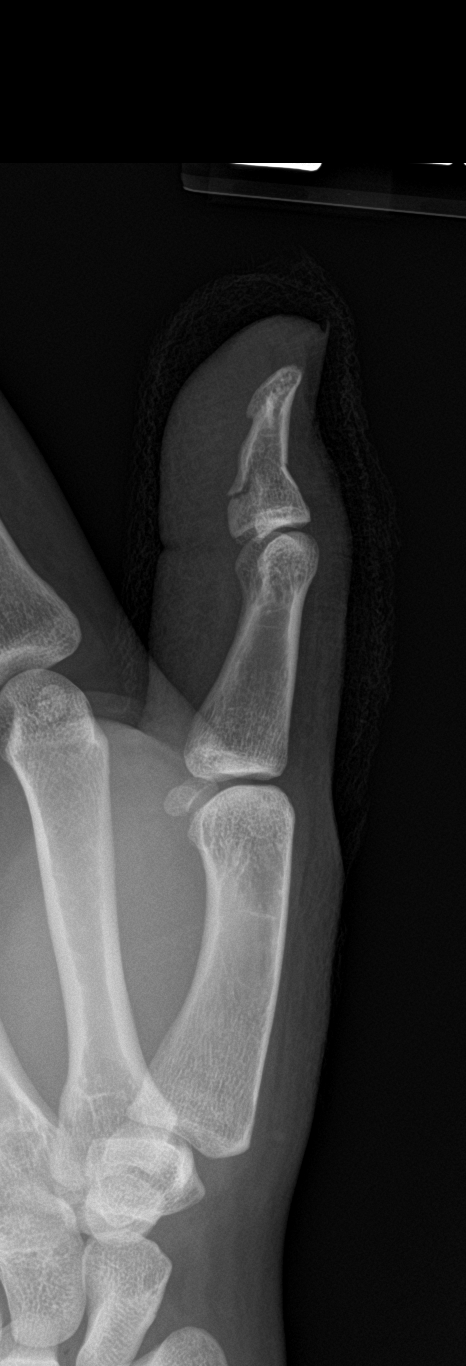

[3 of 3 positions shown; findings below may reference images not displayed]

FINDINGS: Frontal, oblique, and lateral views were obtained. There is an
obliquely oriented fracture of the proximal to mid aspects of the
first distal phalanx with alignment overall near anatomic. No other
fracture. No dislocation. Overlying bandage. No other radiopaque
foreign body.
IMPRESSION: Obliquely oriented fracture involving the proximal to mid aspects of
the first distal phalanx with alignment near anatomic. No other
fracture. No dislocation. No appreciable arthropathy. Overlying
bandage.

These results will be called to the ordering clinician or
representative by the Radiologist Assistant, and communication
documented in the PACS or [REDACTED].

## 2020-10-29 ENCOUNTER — Other Ambulatory Visit: Payer: Self-pay

## 2020-10-29 ENCOUNTER — Ambulatory Visit (INDEPENDENT_AMBULATORY_CARE_PROVIDER_SITE_OTHER): Payer: Medicaid Other | Admitting: Podiatry

## 2020-10-29 ENCOUNTER — Ambulatory Visit (HOSPITAL_COMMUNITY)
Admission: EM | Admit: 2020-10-29 | Discharge: 2020-10-29 | Disposition: A | Discharge status: Home / Self Care | Payer: Medicaid Other | Attending: Family Medicine | Admitting: Family Medicine

## 2020-10-29 ENCOUNTER — Encounter (HOSPITAL_COMMUNITY): Payer: Self-pay | Admitting: Emergency Medicine

## 2020-10-29 ENCOUNTER — Encounter: Payer: Self-pay | Admitting: Podiatry

## 2020-10-29 ENCOUNTER — Ambulatory Visit (INDEPENDENT_AMBULATORY_CARE_PROVIDER_SITE_OTHER): Payer: Medicaid Other

## 2020-10-29 DIAGNOSIS — S91105A Unspecified open wound of left lesser toe(s) without damage to nail, initial encounter: Secondary | ICD-10-CM

## 2020-10-29 DIAGNOSIS — S99922A Unspecified injury of left foot, initial encounter: Secondary | ICD-10-CM

## 2020-10-29 MED ORDER — DOXYCYCLINE HYCLATE 100 MG PO CAPS: 100.0000 mg | ORAL_CAPSULE | Freq: Two times a day (BID) | ORAL | 0 refills | Status: DC

## 2020-10-29 NOTE — ED Triage Notes (Signed)
Pt presents for suture removal. States was seen at [facility] approx 2.5 weeks ago. Denies pain at site but states has had drainage from wound.   States was prescribed antibiotic for 5 days that he has completed.

## 2020-10-30 NOTE — Progress Notes (Signed)
Subjective:   Patient ID: Timothy Haynes, male   DOB: 30 y.o.   MRN: [id number]   HPI Patient presents stating that about 2 weeks ago he had a pallet fall on his left foot and he jerked his foot and jerked some skin off the left second toe.  He had several stitches put in and the urgent care wanted [place] to check this.  They have just put him on doxycycline today and patient does smoke a half a pack per day moderately obese and is not active   Review of Systems  All other systems reviewed and are negative.       Objective:  Physical Exam Vitals and nursing note reviewed.  Constitutional:      Appearance: He is well-developed and well-nourished.  Cardiovascular:     Pulses: Intact distal pulses.  Pulmonary:     Effort: Pulmonary effort is normal.  Musculoskeletal:        General: Normal range of motion.  Skin:    General: Skin is warm.  Neurological:     Mental Status: He is alert.     Neurovascular status was found to be intact muscle strength was adequate with patient found to have traumatized second digit left with a breakdown of tissue which occurs from the medial and lateral side of the distal to phalangeal joint.  It is localized to this area the tissue is crusted the stitches are no longer applying any good pressure to the incision but it is not open and there is no active bleeding     Assessment:  Traumatized left second digit with possibility for other injury and stitches which are no longer providing any support for the wound     Plan:  H&P x-ray reviewed stitches removed flushed the wound it appears clean but I recommend he continue doxycycline applied Iodosorb to dry it out with sterile dressing instructed on continued open toed surgical shoe usage for the next several weeks and it needs to be checked again by [facility] who did the original work on him prior to him returning to work.  This should be uneventful I gave him strict instructions of any increased redness  swelling or drainage were to occur to contact us immediately  X-rays indicate no indications of bony injury associated with this

## 2020-10-30 NOTE — ED Provider Notes (Signed)
  [facility]   [id number] 10/29/20 Arrival Time: 1018  ASSESSMENT & PLAN:  1. Open wound of second toe of left foot, initial encounter     Meds ordered this encounter  Medications  . doxycycline (VIBRAMYCIN) 100 MG capsule    Sig: Take 1 capsule (100 mg total) by mouth 2 (two) times daily.    Dispense:  20 capsule    Refill:  0   Non-healing wound. Referral for podiatry placed. Sutures left in place. To start antibiotic.  Reviewed expectations re: course of current medical issues. Questions answered. Outlined signs and symptoms indicating need for more acute intervention. Patient verbalized understanding. After Visit Summary given.   SUBJECTIVE:  Timothy Haynes is a 30 y.o. male who presents with a laceration of his L second toe approx 2.5 w ago; sutured at [facility] Reports drainage from wound. Minimal discomfort. Afebrile. No bleeding.   Health Maintenance Due  Topic Date Due  . Hepatitis C Screening  Never done  . COVID-19 Vaccine (1) Never done  . HIV Screening  Never done  . INFLUENZA VACCINE  04/13/2020    OBJECTIVE:  Vitals:   10/29/20 1101  BP: 140/88  Pulse: (!) 59  Resp: 18  Temp: 98.9 F (37.2 C)  TempSrc: Oral  SpO2: 100%     General appearance: alert; no distress Skin: non-healing wound of L 2nd toe; no bleeding; mild serous drainage; no bleeding; non-tender; no odor; sutures loose Psychological: alert and cooperative; normal mood and affect   No Known Allergies  History reviewed. No pertinent past medical history. Social History   Socioeconomic History  . Marital status: Single    Spouse name: Not on file  . Number of children: Not on file  . Years of education: Not on file  . Highest education level: Not on file  Occupational History  . Not on file  Tobacco Use  . Smoking status: Current Every Day Smoker    Packs/day: 0.50    Types: Cigarettes  . Smokeless tobacco: Never Used  Substance and Sexual Activity  . Alcohol  use: Yes    Comment: occ  . Drug use: No  . Sexual activity: Yes    Comment: married and monagomous  Other Topics Concern  . Not on file  Social History Narrative  . Not on file   Social Determinants of Health   Financial Resource Strain: Not on file  Food Insecurity: Not on file  Transportation Needs: Not on file  Physical Activity: Not on file  Stress: Not on file  Social Connections: Not on file         Mardella Layman, MD 10/30/20 [phone]

## 2021-08-20 ENCOUNTER — Encounter: Payer: Medicaid Other | Admitting: Family Medicine

## 2022-06-26 ENCOUNTER — Emergency Department (HOSPITAL_COMMUNITY)
Admission: EM | Admit: 2022-06-26 | Discharge: 2022-06-26 | Disposition: A | Discharge status: Home / Self Care | Payer: Medicaid Other | Attending: Emergency Medicine | Admitting: Emergency Medicine

## 2022-06-26 ENCOUNTER — Encounter (HOSPITAL_COMMUNITY): Payer: Self-pay

## 2022-06-26 DIAGNOSIS — E876 Hypokalemia: Secondary | ICD-10-CM | POA: Diagnosis not present

## 2022-06-26 DIAGNOSIS — F17200 Nicotine dependence, unspecified, uncomplicated: Secondary | ICD-10-CM | POA: Diagnosis not present

## 2022-06-26 DIAGNOSIS — Z79899 Other long term (current) drug therapy: Secondary | ICD-10-CM | POA: Insufficient documentation

## 2022-06-26 DIAGNOSIS — M791 Myalgia, unspecified site: Secondary | ICD-10-CM | POA: Diagnosis present

## 2022-06-26 DIAGNOSIS — Z20822 Contact with and (suspected) exposure to covid-19: Secondary | ICD-10-CM | POA: Insufficient documentation

## 2022-06-26 DIAGNOSIS — R748 Abnormal levels of other serum enzymes: Secondary | ICD-10-CM

## 2022-06-26 DIAGNOSIS — R7989 Other specified abnormal findings of blood chemistry: Secondary | ICD-10-CM

## 2022-06-26 LAB — RAPID URINE DRUG SCREEN, HOSP PERFORMED
Amphetamines: POSITIVE — AB
Barbiturates: NOT DETECTED
Benzodiazepines: NOT DETECTED
Cocaine: NOT DETECTED
Opiates: NOT DETECTED
Tetrahydrocannabinol: POSITIVE — AB

## 2022-06-26 LAB — CBC WITH DIFFERENTIAL/PLATELET
Abs Immature Granulocytes: 0.13 10*3/uL — ABNORMAL HIGH (ref 0.00–0.07)
Basophils Absolute: 0.1 10*3/uL (ref 0.0–0.1)
Basophils Relative: 0 %
Eosinophils Absolute: 0 10*3/uL (ref 0.0–0.5)
Eosinophils Relative: 0 %
HCT: 53.9 % — ABNORMAL HIGH (ref 39.0–52.0)
Hemoglobin: 18.5 g/dL — ABNORMAL HIGH (ref 13.0–17.0)
Immature Granulocytes: 1 %
Lymphocytes Relative: 11 %
Lymphs Abs: 2.1 10*3/uL (ref 0.7–4.0)
MCH: 30.4 pg (ref 26.0–34.0)
MCHC: 34.3 g/dL (ref 30.0–36.0)
MCV: 88.7 fL (ref 80.0–100.0)
Monocytes Absolute: 1.3 10*3/uL — ABNORMAL HIGH (ref 0.1–1.0)
Monocytes Relative: 7 %
Neutro Abs: 15.2 10*3/uL — ABNORMAL HIGH (ref 1.7–7.7)
Neutrophils Relative %: 81 %
Platelets: 252 10*3/uL (ref 150–400)
RBC: 6.08 MIL/uL — ABNORMAL HIGH (ref 4.22–5.81)
RDW: 13.2 % (ref 11.5–15.5)
WBC: 18.7 10*3/uL — ABNORMAL HIGH (ref 4.0–10.5)
nRBC: 0 % (ref 0.0–0.2)

## 2022-06-26 LAB — COMPREHENSIVE METABOLIC PANEL
ALT: 27 U/L (ref 0–44)
AST: 33 U/L (ref 15–41)
Albumin: 4.8 g/dL (ref 3.5–5.0)
Alkaline Phosphatase: 60 U/L (ref 38–126)
Anion gap: 11 (ref 5–15)
BUN: 27 mg/dL — ABNORMAL HIGH (ref 6–20)
CO2: 20 mmol/L — ABNORMAL LOW (ref 22–32)
Calcium: 9.5 mg/dL (ref 8.9–10.3)
Chloride: 105 mmol/L (ref 98–111)
Creatinine, Ser: 1.69 mg/dL — ABNORMAL HIGH (ref 0.61–1.24)
GFR, Estimated: 55 mL/min — ABNORMAL LOW (ref >60)
Glucose, Bld: 112 mg/dL — ABNORMAL HIGH (ref 70–99)
Potassium: 3.3 mmol/L — ABNORMAL LOW (ref 3.5–5.1)
Sodium: 136 mmol/L (ref 135–145)
Total Bilirubin: 0.8 mg/dL (ref 0.3–1.2)
Total Protein: 8.2 g/dL — ABNORMAL HIGH (ref 6.5–8.1)

## 2022-06-26 LAB — RESP PANEL BY RT-PCR (FLU A&B, COVID) ARPGX2
Influenza A by PCR: NEGATIVE
Influenza B by PCR: NEGATIVE
SARS Coronavirus 2 by RT PCR: NEGATIVE

## 2022-06-26 LAB — URINALYSIS, ROUTINE W REFLEX MICROSCOPIC
Bilirubin Urine: NEGATIVE
Glucose, UA: NEGATIVE mg/dL
Hgb urine dipstick: NEGATIVE
Ketones, ur: 5 mg/dL — AB
Nitrite: NEGATIVE
Protein, ur: 100 mg/dL — AB
Specific Gravity, Urine: 1.025 (ref 1.005–1.030)
pH: 5 (ref 5.0–8.0)

## 2022-06-26 LAB — ETHANOL: Alcohol, Ethyl (B): <10 mg/dL (ref <10)

## 2022-06-26 LAB — CK: Total CK: 2262 U/L — ABNORMAL HIGH (ref 49–397)

## 2022-06-26 MED ORDER — POTASSIUM CHLORIDE CRYS ER 20 MEQ PO TBCR
40.0000 meq | EXTENDED_RELEASE_TABLET | Freq: Once | ORAL | Status: AC
  Administered 2022-06-26: 40 meq via ORAL
  Filled 2022-06-26: qty 2

## 2022-06-26 MED ORDER — SODIUM CHLORIDE 0.9 % IV SOLN
1.0000 g | Freq: Once | INTRAVENOUS | Status: AC
  Administered 2022-06-26: 1 g via INTRAVENOUS
  Filled 2022-06-26: qty 10

## 2022-06-26 MED ORDER — DOXYCYCLINE HYCLATE 100 MG PO CAPS: 100.0000 mg | ORAL_CAPSULE | Freq: Two times a day (BID) | ORAL | 0 refills | Status: DC

## 2022-06-26 MED ORDER — METHOCARBAMOL 500 MG PO TABS
500.0000 mg | ORAL_TABLET | Freq: Once | ORAL | Status: AC
  Administered 2022-06-26: 500 mg via ORAL
  Filled 2022-06-26: qty 1

## 2022-06-26 MED ORDER — POTASSIUM CHLORIDE CRYS ER 20 MEQ PO TBCR: 20.0000 meq | EXTENDED_RELEASE_TABLET | Freq: Every day | ORAL | 0 refills | Status: DC

## 2022-06-26 MED ORDER — METHOCARBAMOL 500 MG PO TABS: 500.0000 mg | ORAL_TABLET | Freq: Three times a day (TID) | ORAL | 0 refills | Status: DC | PRN

## 2022-06-26 MED ORDER — SODIUM CHLORIDE 0.9 % IV BOLUS
1000.0000 mL | Freq: Once | INTRAVENOUS | Status: AC
  Administered 2022-06-26: 1000 mL via INTRAVENOUS

## 2022-06-26 NOTE — ED Notes (Signed)
Reminded pt of need for urine sample. Urinal left at bedside. Will check back for urine.

## 2022-06-26 NOTE — ED Notes (Signed)
Immediately upon entering room to greet pt and obtain v/s, pt asking for water. I advised that we cannot give him anything until a provider says it's okay. Pt replied that he needs something to drink. I reiterated that we cannot give him anything right now.

## 2022-06-26 NOTE — ED Provider Notes (Signed)
[facility]   CSN: [id number] Arrival date & time: 06/26/22  0216     History  Chief Complaint  Patient presents with   Muscle Pain    Timothy Haynes is a 31 y.o. male with a history of tobacco use who presents to the emergency department with complaints of myalgias that began around 2200.  Patient reports he is having muscle cramping throughout his entire body especially in the hands and legs, waxing and waning in severity, no alleviating or aggravating factors.  Having some pain in his back on both sides as well.  Also had some nausea and dark urine that didn't seem quite right to him.  He did not perform any significant physical activity to stay or spend time in the sun.  Of note he did take a ecstasy pill today, he relayed this was around 10AM, he smokes marijuana, no other drug use reported. Denies fever, vomiting, chest pain, dyspnea, dysuria, or syncope.   HPI     Home Medications Prior to Admission medications   Medication Sig Start Date End Date Taking? Authorizing Provider  doxycycline (VIBRAMYCIN) 100 MG capsule Take 1 capsule (100 mg total) by mouth 2 (two) times daily. Patient not taking: Reported on 06/26/2022 10/29/20   Mardella Layman, MD  HYDROcodone-acetaminophen (NORCO/VICODIN) 5-325 MG tablet Take 1 tablet by mouth every 6 (six) hours as needed for severe pain. Patient not taking: Reported on 06/26/2022 01/24/20   Wallis Bamberg, PA-C  naproxen (NAPROSYN) 500 MG tablet Take 1 tablet (500 mg total) by mouth 2 (two) times daily. Patient not taking: Reported on 06/26/2022 01/24/20   Wallis Bamberg, PA-C      Allergies    Patient has no known allergies.    Review of Systems   Review of Systems  Constitutional:  Negative for fever.  Respiratory:  Negative for shortness of breath.   Cardiovascular:  Negative for chest pain.  Gastrointestinal:  Positive for nausea. Negative for vomiting.  Genitourinary:  Negative for  dysuria.  Musculoskeletal:  Positive for back pain and myalgias.  Neurological:  Negative for syncope.  All other systems reviewed and are negative.   Physical Exam Updated Vital Signs BP (!) 160/108 (BP Location: Right Arm)   Pulse (!) 104   Temp 97.6 F (36.4 C) (Oral)   Resp (!) 24   SpO2 97%  Physical Exam Vitals and nursing note reviewed.  Constitutional:      General: He is not in acute distress.    Appearance: He is well-developed. He is not toxic-appearing.  HENT:     Head: Normocephalic and atraumatic.  Eyes:     General:        Right eye: No discharge.        Left eye: No discharge.     Conjunctiva/sclera: Conjunctivae normal.  Cardiovascular:     Rate and Rhythm: Normal rate and regular rhythm.     Comments: 2+ radial and DP pulses bilaterally. Pulmonary:     Effort: No respiratory distress.     Breath sounds: Normal breath sounds. No wheezing or rales.  Abdominal:     General: There is no distension.     Palpations: Abdomen is soft.     Tenderness: There is no abdominal tenderness. There is no right CVA tenderness, left CVA tenderness, guarding or rebound.  Musculoskeletal:     Cervical back: Neck supple.     Comments: Upper/lower extremities: No obvious deformities, erythema, or significant open wounds  noted.  Actively ranging throughout bilateral upper and lower extremities without focal bony tenderness.  Compartments are soft. Back: No point/focal vertebral tenderness or palpable step-off.  Skin:    General: Skin is warm and dry.  Neurological:     Mental Status: He is alert.     Comments: Sensation grossly tact bilateral upper and lower extremities.  5-5 symmetric grip strength and strength with plantar dorsiflexion bilaterally.  Clear speech.   Psychiatric:        Behavior: Behavior normal.     ED Results / Procedures / Treatments   Labs (all labs ordered are listed, but only abnormal results are displayed) Labs Reviewed  CBC WITH  DIFFERENTIAL/PLATELET - Abnormal; Notable for the following components:      Result Value   WBC 18.7 (*)    RBC 6.08 (*)    Hemoglobin 18.5 (*)    HCT 53.9 (*)    Neutro Abs 15.2 (*)    Monocytes Absolute 1.3 (*)    Abs Immature Granulocytes 0.13 (*)    All other components within normal limits  COMPREHENSIVE METABOLIC PANEL  ETHANOL  RAPID URINE DRUG SCREEN, HOSP PERFORMED  URINALYSIS, ROUTINE W REFLEX MICROSCOPIC  CK    EKG None  Radiology No results found.  Procedures Procedures    Medications Ordered in ED Medications  methocarbamol (ROBAXIN) tablet 500 mg (500 mg Oral Given 06/26/22 0314)  sodium chloride 0.9 % bolus 1,000 mL (1,000 mLs Intravenous New Bag/Given 06/26/22 0323)    ED Course/ Medical Decision Making/ A&P                           Medical Decision Making Amount and/or Complexity of Data Reviewed Labs: ordered.  Risk Prescription drug management.  Patient presents to the ED with complaints of muscle cramps, this involves an extensive number of treatment options, and is a complaint that carries with it a high risk of complications and morbidity. Nontoxic, vitals w/ mild tachycardia & HTN on arrival- doubt HTN emergency.  Moving all extremities without difficulty, neurovascular intact distally x4.  Abdomen nontender without peritoneal signs.  Will give Robaxin, fluids, and reevaluate.  Additional history obtained:  Chart/nursing notes reviewed  Lab Tests:  I viewed & interpreted labs including:  CBC: Leukocytosis, elevated hemoglobin hematocrit, suspect hemoconcentrated. CMP: Elevated BUN and creatinine as well as hypokalemia noted CK: Elevated at 2262 Ethanol level: Negative COVID/flu testing: Negative UA and UDS are pending.  ED Course:  I ordered medications including fluids and Robaxin.   On reassessment patient woken from sleep, he is resting comfortably, he feels much better, states he is pain-free at this time.  Suspect patient very  dehydrated after ecstasy use with his elevated wbc, hgb/hct, and mild CK & creatinine elevations- discussed findings and plan of care with supervising physician Dr. Roxanne Mins- will give potassium & IVFs (2Ls) and subsequent discharge with outpatient follow up in regards to this- patient and his wife updated on results & plan of care & is in agreement. Remains w/ UA pending.   07:05: Patient care signed out to PA Harris @ shift change pending UA and likely discharge home.   Portions of this note were generated with [employer]. Dictation errors may occur despite best attempts at proofreading.   Final Clinical Impression(s) / ED Diagnoses Final diagnoses:  Myalgia  Hypokalemia  Elevated serum creatinine  Elevated CK    Rx / DC Orders ED Discharge Orders  Ordered    potassium chloride SA (KLOR-CON M) 20 MEQ tablet  Daily        06/26/22 0711    methocarbamol (ROBAXIN) 500 MG tablet  Every 8 hours PRN        06/26/22 0711              Cherly Anderson, PA-C 06/26/22 0719    Dione Booze, MD 06/26/22 [phone]

## 2022-06-26 NOTE — ED Provider Notes (Signed)
Shift handoff PA Petrucelli Patient her with mm cramps, did X earlier Sig dehydration- waiting for UA to return, dc if negative  Patient's urine returns with bacteria and white cells.  Will treat as if the patient has an STD.  I have ordered GC chlamydia probe.  He is advised on  sexual contact precautions and to have himself and all partners treated.  Patient appears otherwise appropriate for discharge at this time.  Also advised per previous PA to have close outpatient follow-up for reevaluation of creatinine.   Margarita Mail, PA-C 06/26/22 1425    Ezequiel Essex, MD 06/26/22 1520

## 2022-06-26 NOTE — ED Triage Notes (Signed)
Pt. BIB [facility] for low back pain. Pt. Denies having problems urinating, but has had darker than normal urine. Pt. Given 100 fent. En rout by EMS. EMS: VS HR:102 BP: 152/90

## 2022-06-26 NOTE — Discharge Instructions (Addendum)
It is rare for young men to have a Urinary tract infection. The most common cause is STD. It is important that you and your partner get checked if either of you are at risk for this.  You are being treated with rocephin and doxycyline- make sure to take ALL of your medication until complete.   You were seen in the emergency department today for muscle cramps.  Your labs show that you are dehydrated, you are given fluids in the ED, you would like you to have your metabolic panel, white blood cell count, and CK rechecked by your primary care provider early next week.  We suspect these were all somewhat abnormal due to dehydration.  You were given fluids to help with this.  Your potassium was low, you are given oral potassium we are sending you home with a supplement to take for the next few days.  Please include potassium rich foods in your diet.  Please avoid drugs as these can further aggravate some of your symptoms and laboratory findings.   Take Robaxin as needed for muscle spasm/cramps.  Do not drive or operate heavy machinery after taking Robaxin as it can make you sleepy.   We have prescribed you new medication(s) today. Discuss the medications prescribed today with your pharmacist as they can have adverse effects and interactions with your other medicines including over the counter and prescribed medications. Seek medical evaluation if you start to experience new or abnormal symptoms after taking one of these medicines, seek care immediately if you start to experience difficulty breathing, feeling of your throat closing, facial swelling, or rash as these could be indications of a more serious allergic reaction  Please still hydrated drinking plenty of electrolyte containing fluids.   Follow-up with primary care within 3 days.  Return to the ED for new or worsening symptoms including but not limited to new or worsening pain, fever, inability to keep fluids down, dark/[employer] urine, passing out,  trouble moving a certain extremity, or any other concerns.  Free HIV and STD Testing These locations offer FREE confidential testing for HIV, Chlamydia, Gonorrhea, and Syphilis. Non-Traditional Testing Sites Address Telephone  [facility] [phone] Mondays [place] [address] [place] [phone] Wednesdays 2pm-8pm  [facility] [address] [phone] Thursdays 9am-12noon 1pm-4pm  [facility] [address] [place] [phone] Tuesdays Thursdays 9am-12noon 1pm-4pm  [place] offers free, confidential testing and treatment for HIV, Chlamydia, Gonorrhea, Syphilis, Herpes, Bacterial Vaginosis, Yeast, and Trichomoniasis. [place] [facility] [address], [place]  Monday thru Friday  Call for an appointment  [facility] Department- [facility] [address] [place] [phone] Monday thru Friday  Call for anappointment.  If you have any questions about this information please call [phone]. 07/22/2011

## 2022-06-28 LAB — GC/CHLAMYDIA PROBE AMP (~~LOC~~) NOT AT ARMC
Chlamydia: NEGATIVE
Comment: NEGATIVE
Comment: NORMAL
Neisseria Gonorrhea: NEGATIVE

## 2022-10-12 ENCOUNTER — Other Ambulatory Visit: Payer: Self-pay

## 2022-10-12 ENCOUNTER — Encounter (HOSPITAL_COMMUNITY): Payer: Self-pay

## 2022-10-12 ENCOUNTER — Emergency Department (HOSPITAL_COMMUNITY)
Admission: EM | Admit: 2022-10-12 | Discharge: 2022-10-12 | Discharge status: Against Medical Advice | Payer: Medicaid Other | Attending: Emergency Medicine | Admitting: Emergency Medicine

## 2022-10-12 DIAGNOSIS — R569 Unspecified convulsions: Secondary | ICD-10-CM | POA: Diagnosis not present

## 2022-10-12 DIAGNOSIS — Z5321 Procedure and treatment not carried out due to patient leaving prior to being seen by health care provider: Secondary | ICD-10-CM | POA: Insufficient documentation

## 2022-10-12 LAB — CBC WITH DIFFERENTIAL/PLATELET
Abs Immature Granulocytes: 0.03 10*3/uL (ref 0.00–0.07)
Basophils Absolute: 0 10*3/uL (ref 0.0–0.1)
Basophils Relative: 0 %
Eosinophils Absolute: 0 10*3/uL (ref 0.0–0.5)
Eosinophils Relative: 0 %
HCT: 47.9 % (ref 39.0–52.0)
Hemoglobin: 16 g/dL (ref 13.0–17.0)
Immature Granulocytes: 0 %
Lymphocytes Relative: 16 %
Lymphs Abs: 1.9 10*3/uL (ref 0.7–4.0)
MCH: 30.7 pg (ref 26.0–34.0)
MCHC: 33.4 g/dL (ref 30.0–36.0)
MCV: 91.8 fL (ref 80.0–100.0)
Monocytes Absolute: 0.6 10*3/uL (ref 0.1–1.0)
Monocytes Relative: 5 %
Neutro Abs: 9 10*3/uL — ABNORMAL HIGH (ref 1.7–7.7)
Neutrophils Relative %: 79 %
Platelets: 206 10*3/uL (ref 150–400)
RBC: 5.22 MIL/uL (ref 4.22–5.81)
RDW: 13.1 % (ref 11.5–15.5)
WBC: 11.6 10*3/uL — ABNORMAL HIGH (ref 4.0–10.5)
nRBC: 0 % (ref 0.0–0.2)

## 2022-10-12 LAB — BASIC METABOLIC PANEL
Anion gap: 9 (ref 5–15)
BUN: 9 mg/dL (ref 6–20)
CO2: 23 mmol/L (ref 22–32)
Calcium: 9.1 mg/dL (ref 8.9–10.3)
Chloride: 107 mmol/L (ref 98–111)
Creatinine, Ser: 0.89 mg/dL (ref 0.61–1.24)
GFR, Estimated: >60 mL/min (ref >60)
Glucose, Bld: 92 mg/dL (ref 70–99)
Potassium: 3.5 mmol/L (ref 3.5–5.1)
Sodium: 139 mmol/L (ref 135–145)

## 2022-10-12 LAB — CBG MONITORING, ED: Glucose-Capillary: 95 mg/dL (ref 70–99)

## 2022-10-12 NOTE — ED Provider Triage Note (Signed)
Emergency Medicine Provider Triage Evaluation Note  Timothy Haynes , a 32 y.o. male  was evaluated in triage.  Pt complains of possible seizure.  Patient states he took a nap this afternoon and woke up on the floor and believes he bit his tongue.  His wife states that earlier when he was sitting in the bed, he jumped up, went blank, and was profusely sweating but not making any sense.  Patient states right now he is having a headache.  Patient states for the past week, he has had episodes of confusion with weird tastes in his mouth.  No known history of seizures.   Review of Systems  Positive: As above Negative: As above  Physical Exam  BP (!) 146/79 (BP Location: Right Arm)   Pulse 63   Temp 97.9 F (36.6 C) (Oral)   Resp 18   Ht 5\' 5"  (1.651 m)   Wt 115.8 kg   SpO2 100%   BMI 42.48 kg/m  Gen:   Awake, no distress   Resp:  Normal effort  MSK:   Moves extremities without difficulty  Other:    Medical Decision Making  Medically screening exam initiated at 5:19 PM.  Appropriate orders placed.  Timothy Haynes was informed that the remainder of the evaluation will be completed by another provider, this initial triage assessment does not replace that evaluation, and the importance of remaining in the ED until their evaluation is complete.     Theressa Stamps R, [place] 10/12/22 [phone]

## 2022-10-12 NOTE — ED Triage Notes (Addendum)
Patient said at 12pm he took a nap and then at 3pm he woke up on the floor. The table was pushed over to the side. Bit the side of his tongue on the left. His wife said earlier he was sitting and went blank. He said he had a seizure but has never been diagnosed. Head hurts in the back. No blood thinners.

## 2022-10-13 ENCOUNTER — Emergency Department (HOSPITAL_COMMUNITY)
Admission: EM | Admit: 2022-10-13 | Discharge: 2022-10-13 | Disposition: A | Discharge status: Home / Self Care | Payer: Medicaid Other | Attending: Emergency Medicine | Admitting: Emergency Medicine

## 2022-10-13 ENCOUNTER — Other Ambulatory Visit: Payer: Self-pay

## 2022-10-13 ENCOUNTER — Emergency Department (HOSPITAL_COMMUNITY): Payer: Medicaid Other

## 2022-10-13 DIAGNOSIS — F1210 Cannabis abuse, uncomplicated: Secondary | ICD-10-CM | POA: Diagnosis not present

## 2022-10-13 DIAGNOSIS — R519 Headache, unspecified: Secondary | ICD-10-CM | POA: Insufficient documentation

## 2022-10-13 DIAGNOSIS — R55 Syncope and collapse: Secondary | ICD-10-CM | POA: Insufficient documentation

## 2022-10-13 DIAGNOSIS — R569 Unspecified convulsions: Secondary | ICD-10-CM | POA: Diagnosis present

## 2022-10-13 DIAGNOSIS — R258 Other abnormal involuntary movements: Secondary | ICD-10-CM | POA: Diagnosis not present

## 2022-10-13 LAB — CBC
HCT: 46.5 % (ref 39.0–52.0)
Hemoglobin: 15.9 g/dL (ref 13.0–17.0)
MCH: 31 pg (ref 26.0–34.0)
MCHC: 34.2 g/dL (ref 30.0–36.0)
MCV: 90.6 fL (ref 80.0–100.0)
Platelets: 207 10*3/uL (ref 150–400)
RBC: 5.13 MIL/uL (ref 4.22–5.81)
RDW: 13.2 % (ref 11.5–15.5)
WBC: 8.3 10*3/uL (ref 4.0–10.5)
nRBC: 0 % (ref 0.0–0.2)

## 2022-10-13 LAB — BASIC METABOLIC PANEL
Anion gap: 9 (ref 5–15)
BUN: 6 mg/dL (ref 6–20)
CO2: 22 mmol/L (ref 22–32)
Calcium: 9.2 mg/dL (ref 8.9–10.3)
Chloride: 108 mmol/L (ref 98–111)
Creatinine, Ser: 1.09 mg/dL (ref 0.61–1.24)
GFR, Estimated: >60 mL/min (ref >60)
Glucose, Bld: 98 mg/dL (ref 70–99)
Potassium: 3.7 mmol/L (ref 3.5–5.1)
Sodium: 139 mmol/L (ref 135–145)

## 2022-10-13 LAB — URINALYSIS, ROUTINE W REFLEX MICROSCOPIC
Bilirubin Urine: NEGATIVE
Glucose, UA: NEGATIVE mg/dL
Hgb urine dipstick: NEGATIVE
Ketones, ur: NEGATIVE mg/dL
Nitrite: NEGATIVE
Protein, ur: NEGATIVE mg/dL
Specific Gravity, Urine: 1.02 (ref 1.005–1.030)
WBC, UA: >50 WBC/hpf (ref 0–5)
pH: 5 (ref 5.0–8.0)

## 2022-10-13 LAB — RAPID URINE DRUG SCREEN, HOSP PERFORMED
Amphetamines: NOT DETECTED
Barbiturates: NOT DETECTED
Benzodiazepines: NOT DETECTED
Cocaine: NOT DETECTED
Opiates: NOT DETECTED
Tetrahydrocannabinol: POSITIVE — AB

## 2022-10-13 MED ORDER — CEFADROXIL 500 MG PO CAPS: 500.0000 mg | ORAL_CAPSULE | Freq: Two times a day (BID) | ORAL | 0 refills | Status: DC

## 2022-10-13 MED ORDER — LEVETIRACETAM 500 MG PO TABS: 500.0000 mg | ORAL_TABLET | Freq: Two times a day (BID) | ORAL | 0 refills | Status: DC

## 2022-10-13 MED ORDER — SODIUM CHLORIDE 0.9 % IV BOLUS
1000.0000 mL | Freq: Once | INTRAVENOUS | Status: AC
  Administered 2022-10-13: 1000 mL via INTRAVENOUS

## 2022-10-13 NOTE — ED Triage Notes (Addendum)
Pt. Stated, yesterday I went to sleep on the couch yesterday around noon , when I woke up (430)I had hit the table beside the couch and was facing the other way. ? Seizure. A couple of days before this happened I had some dizzy spells. I went to [facility] around 500 and they did lab work and a EKG . I went to lobby and they never saw me so I left .

## 2022-10-13 NOTE — ED Provider Notes (Signed)
[facility] Provider Note   CSN: [id number] Arrival date & time: 10/13/22  0941     History  Chief Complaint  Patient presents with   Loss of Consciousness    Timothy Haynes is a 32 y.o. male Pt complains of concern for possible seizure.  Patient reports that yesterday he went to sleep on the couch, woke up several hours later, had hit the table beside the couch and was facing a different direction as well as had bitten his tongue.  His wife had reported that he earlier in the day had set up with a blank expression, and was not making any sense.  He had been complaining of a headache.  Patient reports that he has been having some intermittent dizzy spells for around a week with an odd taste in his mouth.  He denies any nausea, vomiting, numbness, tingling, vision changes, he denies any alcohol, drug use.  He denies any previous history of seizure.    Loss of Consciousness Associated symptoms: seizures        Home Medications Prior to Admission medications   Medication Sig Start Date End Date Taking? Authorizing Provider  cefadroxil (DURICEF) 500 MG capsule Take 1 capsule (500 mg total) by mouth 2 (two) times daily. 10/13/22  Yes Kristine Tiley H, PA-C  levETIRAcetam (KEPPRA) 500 MG tablet Take 1 tablet (500 mg total) by mouth 2 (two) times daily. 10/13/22  Yes Notnamed Croucher H, PA-C  doxycycline (VIBRAMYCIN) 100 MG capsule Take 1 capsule (100 mg total) by mouth 2 (two) times daily. Patient not taking: Reported on 06/26/2022 10/29/20   Mardella Layman, MD  doxycycline (VIBRAMYCIN) 100 MG capsule Take 1 capsule (100 mg total) by mouth 2 (two) times daily. One po bid x 7 days Patient not taking: Reported on 10/13/2022 06/26/22   Arthor Captain, PA-C  HYDROcodone-acetaminophen (NORCO/VICODIN) 5-325 MG tablet Take 1 tablet by mouth every 6 (six) hours as needed for severe pain. Patient not taking: Reported on 06/26/2022 01/24/20   Wallis Bamberg, PA-C  methocarbamol (ROBAXIN) 500 MG tablet Take 1 tablet (500 mg total) by mouth every 8 (eight) hours as needed for muscle spasms. Patient not taking: Reported on 10/13/2022 06/26/22   Petrucelli, Lelon Mast R, PA-C  naproxen (NAPROSYN) 500 MG tablet Take 1 tablet (500 mg total) by mouth 2 (two) times daily. Patient not taking: Reported on 06/26/2022 01/24/20   Wallis Bamberg, PA-C  potassium chloride SA (KLOR-CON M) 20 MEQ tablet Take 1 tablet (20 mEq total) by mouth daily. Patient not taking: Reported on 10/13/2022 06/26/22   Petrucelli, Pleas Koch, PA-C      Allergies    Patient has no known allergies.    Review of Systems   Review of Systems  Cardiovascular:  Positive for syncope.  Neurological:  Positive for seizures.  All other systems reviewed and are negative.   Physical Exam Updated Vital Signs BP (!) 133/91   Pulse (!) 54   Temp 98 F (36.7 C)   Resp 13   Ht 5\' 5"  (1.651 m)   Wt 108.9 kg   SpO2 100%   BMI 39.94 kg/m  Physical Exam Vitals and nursing note reviewed.  Constitutional:      General: He is not in acute distress.    Appearance: Normal appearance.  HENT:     Head: Normocephalic and atraumatic.  Eyes:     General:        Right eye: No discharge.  Left eye: No discharge.  Cardiovascular:     Rate and Rhythm: Normal rate and regular rhythm.     Heart sounds: No murmur heard.    No friction rub. No gallop.  Pulmonary:     Effort: Pulmonary effort is normal.     Breath sounds: Normal breath sounds.  Abdominal:     General: Bowel sounds are normal.     Palpations: Abdomen is soft.  Skin:    General: Skin is warm and dry.     Capillary Refill: Capillary refill takes less than 2 seconds.  Neurological:     Mental Status: He is alert and oriented to person, place, and time.     Comments: Cranial nerves II through XII grossly intact.  Intact finger-nose, intact heel-to-shin.  Romberg negative, gait normal.  Alert and oriented x3.  Moves all 4  limbs spontaneously, normal coordination.  No pronator drift.  Intact strength 5 out of 5 bilateral upper and lower extremities.    Psychiatric:        Mood and Affect: Mood normal.        Behavior: Behavior normal.     ED Results / Procedures / Treatments   Labs (all labs ordered are listed, but only abnormal results are displayed) Labs Reviewed  URINALYSIS, ROUTINE W REFLEX MICROSCOPIC - Abnormal; Notable for the following components:      Result Value   APPearance HAZY (*)    Leukocytes,Ua MODERATE (*)    Bacteria, UA FEW (*)    All other components within normal limits  RAPID URINE DRUG SCREEN, HOSP PERFORMED - Abnormal; Notable for the following components:   Tetrahydrocannabinol POSITIVE (*)    All other components within normal limits  URINE CULTURE  CBC  BASIC METABOLIC PANEL  ETHANOL    EKG None  Radiology CT Head Wo Contrast  Result Date: 10/13/2022 CLINICAL DATA:  Possible seizure EXAM: CT HEAD WITHOUT CONTRAST TECHNIQUE: Contiguous axial images were obtained from the base of the skull through the vertex without intravenous contrast. RADIATION DOSE REDUCTION: This exam was performed according to the departmental dose-optimization program which includes automated exposure control, adjustment of the mA and/or kV according to patient size and/or use of iterative reconstruction technique. COMPARISON:  CT head 06/06/2016 FINDINGS: Brain: There is no acute intracranial hemorrhage, extra-axial fluid collection, or acute infarct. Parenchymal volume is normal. The ventricles are normal in size. Gray-white differentiation is preserved. The pituitary and suprasellar region are normal. A posterior fossa arachnoid cyst is unchanged. There is no solid mass lesion. There is no mass effect or midline shift. Vascular: Choose Skull: Normal. Negative for fracture or focal lesion. Sinuses/Orbits: The paranasal sinuses are clear. The globes and orbits are unremarkable. Other: None.  IMPRESSION: Unremarkable head CT with no acute intracranial pathology. Electronically Signed   By: Valetta Mole M.D.   On: 10/13/2022 10:26    Procedures Procedures    Medications Ordered in ED Medications  sodium chloride 0.9 % bolus 1,000 mL (1,000 mLs Intravenous Bolus 10/13/22 1054)    ED Course/ Medical Decision Making/ A&P Clinical Course as of 10/13/22 1342  Wed Oct 13, 2022  1156 Keppra 500 mg bid If UDS positive, no keppra, encourage cessation [CP]    Clinical Course User Index [CP] Anselmo Pickler, PA-C                             Medical Decision Making Amount and/or Complexity of  Data Reviewed Labs: ordered. Radiology: ordered.  Risk Prescription drug management.   This patient is a 32 y.o. male who presents to the ED for concern of syncope versus seizure-like activity with tongue biting after last presentation of seizure-like activity yesterday, this involves an extensive number of treatment options, and is a complaint that carries with it a high risk of complications and morbidity. The emergent differential diagnosis prior to evaluation includes, but is not limited to,  CVA, ACS, arrhythmia, vasovagal syncope, orthostatic hypotension, sepsis, hypoglycemia, electrolyte disturbance, respiratory failure, symptomatic anemia, dehydration, heat injury, polypharmacy, malignancy, anxiety/panic attack . This is not an exhaustive differential.   Past Medical History / Co-morbidities / Social History: History of obesity, prehypertension, otherwise overall unremarkable past medical history  Additional history: Chart reviewed. Pertinent results include: Reviewed lab work, EKG from emergency department visit yesterday  Physical Exam: Physical exam performed. The pertinent findings include: Overall with no focal neurologic deficits, or seizure-like activity in the emergency department, he is not having any syncopal or near syncopal episodes his vital signs are overall  stable, he has mild bradycardic heart rate with pulse ranging from 50-73, he had some show with blood pressure 154/89, normal oxygen saturation and respiratory rate.  He has no fever in the emergency department.  Lab Tests: I ordered, and personally interpreted labs.  The pertinent results include: CBC, BMP are unremarkable.  His UA shows moderate leukocytes, greater than 50 white blood cells and some bacteria, there are some squamous epithelial cells but overall some suspicion for possible UTI, will send for culture to confirm results however, UDS is positive for THC but negative for other drug use   Imaging Studies: I ordered imaging studies including CT head without contrast. I independently visualized and interpreted imaging which showed no acute intracranial abnormality. I agree with the radiologist interpretation.   Cardiac Monitoring:  The patient was maintained on a cardiac monitor.  My attending physician Dr. Vanita Panda viewed and interpreted the cardiac monitored which showed an underlying rhythm of: Normal sinus rhythm, no evidence of arrhythmia just cause for his syncope, repol abnormality noted. I agree with this interpretation.   Medications: I ordered medication including fluid bolus for possible mild dehydration. Reevaluation of the patient after these medicines showed that the patient improved.  Discharged on cefadroxil, as well as Keppra after speaking with neurology.  Consultations Obtained: I requested consultation with the neurologist, spoke with Dr. Lorrin Goodell,  and discussed lab and imaging findings as well as pertinent plan - they recommend: Pending UDS at time of my discussion with neurology, if no significant UDS findings of drugs of abuse he agrees that starting patient on Keppra given his report of suspicious seizure-like activity is a reasonable plan, with close neurology follow-up and seizure precautions   Disposition: After consideration of the diagnostic results and  the patients response to treatment, I feel that able for discharge at this time, will discharge with Keppra, antibiotics to cover for possible UTI.   emergency department workup does not suggest an emergent condition requiring admission or immediate intervention beyond what has been performed at this time. The plan is: as above. The patient is safe for discharge and has been instructed to return immediately for worsening symptoms, change in symptoms or any other concerns.  I discussed this case with my attending physician Dr. Vanita Panda who cosigned this note including patient's presenting symptoms, physical exam, and planned diagnostics and interventions. Attending physician stated agreement with plan or made changes to plan which were  implemented.    Final Clinical Impression(s) / ED Diagnoses Final diagnoses:  Seizure-like activity ([place])    Rx / DC Orders ED Discharge Orders          Ordered    levETIRAcetam (KEPPRA) 500 MG tablet  2 times daily        10/13/22 1325    cefadroxil (DURICEF) 500 MG capsule  2 times daily        10/13/22 1325    Ambulatory referral to Neurology       Comments: An appointment is requested in approximately: 1-2 weeks   10/13/22 1325              Buddy Loeffelholz, Jackalyn Lombard 10/13/22 1342    Carmin Muskrat, MD 10/13/22 [phone]

## 2022-10-13 NOTE — ED Notes (Signed)
Pt given urinal and encouraged to obtain urine sample as soon as possible.

## 2022-10-13 NOTE — ED Provider Triage Note (Signed)
Emergency Medicine Provider Triage Evaluation Note  Timothy Haynes , a 32 y.o. male  was evaluated in triage.  Pt complains of concern for possible seizure.  Patient reports that yesterday he went to sleep on the couch, woke up several hours later, had hit the table beside the couch and was facing a different direction as well as had bitten his tongue.  His wife had reported that he earlier in the day had set up with a blank expression, and was not making any sense.  He had been complaining of a headache.  Patient reports that he has been having some intermittent dizzy spells for around a week with an odd taste in his mouth.  He denies any nausea, vomiting, numbness, tingling, vision changes, he denies any alcohol, drug use.  He denies any previous history of seizure.  Review of Systems  Positive: LOC, dizziness Negative: Witnessed tonic clonic activity  Physical Exam  BP (!) 154/89   Pulse 73   Temp 99 F (37.2 C) (Oral)   Ht 5\' 5"  (1.651 m)   Wt 108.9 kg   SpO2 100%   BMI 39.94 kg/m  Gen:   Awake, no distress   Resp:  Normal effort  MSK:   Moves extremities without difficulty  Other:  Moves all 4 limbs spontaneously, CN II through XII grossly intact, can ambulate without difficulty, intact sensation throughout.   Medical Decision Making  Medically screening exam initiated at 10:02 AM.  Appropriate orders placed.  [name] was informed that the remainder of the evaluation will be completed by another provider, this initial triage assessment does not replace that evaluation, and the importance of remaining in the ED until their evaluation is complete.  Workup initiated in triage    Anselmo Pickler, PA-C 10/13/22 1004

## 2022-10-13 NOTE — Discharge Instructions (Addendum)
You need to maintain seizure precautions until you are able to follow-up with neurology including not driving or piloting any heavy machinery until you you are cleared by your neurologist.  Please take the entire course of antibiotics for presumptive UTI unless you get a call from our pharmacist that you are okay to stop taking it.  Please begin taking Keppra twice daily as directed, unless otherwise advised by the neurologist.  I recommend that you discontinue any drug use including marijuana use.

## 2022-10-14 LAB — URINE CULTURE: Culture: NO GROWTH

## 2022-11-05 ENCOUNTER — Emergency Department (HOSPITAL_COMMUNITY): Payer: Medicaid Other

## 2022-11-05 ENCOUNTER — Other Ambulatory Visit: Payer: Self-pay

## 2022-11-05 ENCOUNTER — Emergency Department (HOSPITAL_COMMUNITY)
Admission: EM | Admit: 2022-11-05 | Discharge: 2022-11-05 | Disposition: A | Discharge status: Home / Self Care | Payer: Medicaid Other | Attending: Emergency Medicine | Admitting: Emergency Medicine

## 2022-11-05 DIAGNOSIS — R569 Unspecified convulsions: Secondary | ICD-10-CM | POA: Diagnosis present

## 2022-11-05 LAB — COMPREHENSIVE METABOLIC PANEL
Albumin: 3.2 g/dL — ABNORMAL LOW (ref 3.5–5.0)
Alkaline Phosphatase: 44 U/L (ref 38–126)
Anion gap: 4 — ABNORMAL LOW (ref 5–15)
BUN: 14 mg/dL (ref 6–20)
CO2: 19 mmol/L — ABNORMAL LOW (ref 22–32)
Calcium: 7.8 mg/dL — ABNORMAL LOW (ref 8.9–10.3)
Chloride: 108 mmol/L (ref 98–111)
Creatinine, Ser: 0.94 mg/dL (ref 0.61–1.24)
GFR, Estimated: >60 mL/min (ref >60)
Glucose, Bld: 100 mg/dL — ABNORMAL HIGH (ref 70–99)
Sodium: 131 mmol/L — ABNORMAL LOW (ref 135–145)
Total Bilirubin: 0.6 mg/dL (ref 0.3–1.2)

## 2022-11-05 LAB — BRAIN NATRIURETIC PEPTIDE: B Natriuretic Peptide: 22.9 pg/mL (ref 0.0–100.0)

## 2022-11-05 LAB — CBC WITH DIFFERENTIAL/PLATELET
Abs Immature Granulocytes: 0.09 10*3/uL — ABNORMAL HIGH (ref 0.00–0.07)
Basophils Absolute: 0 10*3/uL (ref 0.0–0.1)
Basophils Relative: 0 %
Eosinophils Absolute: 0.1 10*3/uL (ref 0.0–0.5)
Eosinophils Relative: 1 %
HCT: 46.4 % (ref 39.0–52.0)
Hemoglobin: 15.2 g/dL (ref 13.0–17.0)
Immature Granulocytes: 1 %
Lymphocytes Relative: 23 %
Lymphs Abs: 2 10*3/uL (ref 0.7–4.0)
MCH: 31.1 pg (ref 26.0–34.0)
MCHC: 32.8 g/dL (ref 30.0–36.0)
MCV: 94.9 fL (ref 80.0–100.0)
Monocytes Absolute: 0.6 10*3/uL (ref 0.1–1.0)
Monocytes Relative: 6 %
Neutro Abs: 6.1 10*3/uL (ref 1.7–7.7)
Neutrophils Relative %: 69 %
Platelets: 171 10*3/uL (ref 150–400)
RBC: 4.89 MIL/uL (ref 4.22–5.81)
RDW: 14.3 % (ref 11.5–15.5)
WBC: 9 10*3/uL (ref 4.0–10.5)
nRBC: 0 % (ref 0.0–0.2)

## 2022-11-05 LAB — TROPONIN I (HIGH SENSITIVITY): Troponin I (High Sensitivity): 4 ng/L (ref <18)

## 2022-11-05 LAB — POTASSIUM: Potassium: 3.9 mmol/L (ref 3.5–5.1)

## 2022-11-05 LAB — MAGNESIUM: Magnesium: 2.7 mg/dL — ABNORMAL HIGH (ref 1.7–2.4)

## 2022-11-05 LAB — D-DIMER, QUANTITATIVE: D-Dimer, Quant: 0.37 ug/mL-FEU (ref 0.00–0.50)

## 2022-11-05 MED ORDER — LEVETIRACETAM 500 MG PO TABS: 500.0000 mg | ORAL_TABLET | Freq: Two times a day (BID) | ORAL | 1 refills | Status: DC

## 2022-11-05 MED ORDER — LEVETIRACETAM IN NACL 1000 MG/100ML IV SOLN
1000.0000 mg | INTRAVENOUS | Status: AC
  Administered 2022-11-05 (×2): 1000 mg via INTRAVENOUS
  Filled 2022-11-05 (×2): qty 100

## 2022-11-05 MED ORDER — LACTATED RINGERS IV BOLUS
1000.0000 mL | Freq: Once | INTRAVENOUS | Status: AC
  Administered 2022-11-05: 1000 mL via INTRAVENOUS

## 2022-11-05 MED ORDER — SODIUM CHLORIDE 0.9 % IV SOLN: 2000.0000 mg | Freq: Once | INTRAVENOUS | Status: DC

## 2022-11-05 NOTE — ED Notes (Signed)
Pt spouse states neurologist appt 3/5 but would like to see what is going on now d/t seizure activity is new.   Pt states he has aches that start from the front of his head and travel to the back of his head.   Pt was wondering if a scan or eeg could be completed to r/o symptoms.

## 2022-11-05 NOTE — ED Triage Notes (Signed)
Pt arrives to ED c/o 2 seizure-like activities approx 5 mins each. Pt with bite injury to tongue, Pt with hx of seizures and has not taken Keppra x 1 month. Pt a/o x4

## 2022-11-05 NOTE — ED Notes (Signed)
Discharge instructions reviewed with patient and significant other at bedside with MD. Patient's significant other denies any questions or concerns, verbalized understanding of need for follow up care. Patient out to lobby via wheelchair.

## 2022-11-05 NOTE — ED Provider Notes (Signed)
[facility] Provider Note   CSN: [id number] Arrival date & time: 11/05/22  0208     History  Chief Complaint  Patient presents with   Seizures    Timothy Haynes is a 32 y.o. male.  32 year old male who presents ER today with seizure-like activity.  He was diagnosed with possible seizures a month ago was supposed be taking Keppra but never took it because he did not think he needed it.  His wife states that tonight he had an episode of violent thrashing of arms and legs and unresponsiveness lasted about 5 minutes.  He then had 1/32 pause and this episode and then it happened again for another about 5 minutes.  Then took about 20 minutes for him to return to baseline.  No family history of seizures.  He does have progressively worsening orthostatic symptoms over the last as well.  He states that he will get lightheaded dizzy and nauseous when he stands up after he lays down it seems to  get better.   Seizures      Home Medications Prior to Admission medications   Medication Sig Start Date End Date Taking? Authorizing Provider  levETIRAcetam (KEPPRA) 500 MG tablet Take 1 tablet (500 mg total) by mouth 2 (two) times daily. 11/05/22  Yes Jazmina Muhlenkamp, Corene Cornea, MD  cefadroxil (DURICEF) 500 MG capsule Take 1 capsule (500 mg total) by mouth 2 (two) times daily. 10/13/22   Prosperi, Christian H, PA-C      Allergies    Patient has no known allergies.    Review of Systems   Review of Systems  Neurological:  Positive for seizures.    Physical Exam Updated Vital Signs BP 112/70 (BP Location: Right Arm)   Pulse (!) 58   Temp 98.1 F (36.7 C) (Oral)   Resp 16   Ht '5\' 4"'$  (1.626 m)   Wt 120.2 kg   SpO2 97%   BMI 45.49 kg/m  Physical Exam Vitals and nursing note reviewed.  Constitutional:      Appearance: He is well-developed.  HENT:     Head: Normocephalic and atraumatic.     Comments: Trauma to left side of tongue Eyes:     Pupils: Pupils  are equal, round, and reactive to light.  Cardiovascular:     Rate and Rhythm: Normal rate.  Pulmonary:     Effort: Pulmonary effort is normal. No respiratory distress.  Abdominal:     General: Abdomen is flat. There is no distension.  Musculoskeletal:        General: No swelling or tenderness. Normal range of motion.     Cervical back: Normal range of motion.  Skin:    General: Skin is warm and dry.  Neurological:     General: No focal deficit present.     Mental Status: He is alert.     ED Results / Procedures / Treatments   Labs (all labs ordered are listed, but only abnormal results are displayed) Labs Reviewed  CBC WITH DIFFERENTIAL/PLATELET - Abnormal; Notable for the following components:      Result Value   Abs Immature Granulocytes 0.09 (*)    All other components within normal limits  COMPREHENSIVE METABOLIC PANEL - Abnormal; Notable for the following components:   Sodium 131 (*)    CO2 19 (*)    Glucose, Bld 100 (*)    Calcium 7.8 (*)    Albumin 3.2 (*)    Anion gap 4 (*)  All other components within normal limits  MAGNESIUM - Abnormal; Notable for the following components:   Magnesium 2.7 (*)    All other components within normal limits  BRAIN NATRIURETIC PEPTIDE  D-DIMER, QUANTITATIVE (NOT AT [facility])  POTASSIUM  TROPONIN I (HIGH SENSITIVITY)    EKG EKG Interpretation  Date/Time:  Friday November 05 2022 02:39:46 EST Ventricular Rate:  80 PR Interval:  176 QRS Duration: 95 QT Interval:  371 QTC Calculation: 428 R Axis:   76 Text Interpretation: Sinus rhythm Low voltage, precordial leads Confirmed by Merrily Pew [phone]) on 11/05/2022 3:28:14 AM  Radiology DG Chest Portable 1 View  Result Date: 11/05/2022 CLINICAL DATA:  eval for syncope EXAM: PORTABLE CHEST 1 VIEW COMPARISON:  Chest x-ray 10/23/2018 FINDINGS: The heart and mediastinal contours are unchanged. No focal consolidation. No pulmonary edema. No pleural effusion. No pneumothorax. No acute  osseous abnormality. IMPRESSION: No active disease. Electronically Signed   By: Iven Finn M.D.   On: 11/05/2022 03:10    Procedures Procedures    Medications Ordered in ED Medications  lactated ringers bolus 1,000 mL (0 mLs Intravenous Stopped 11/05/22 0452)  levETIRAcetam (KEPPRA) IVPB 1000 mg/100 mL premix (0 mg Intravenous Stopped 11/05/22 0344)    ED Course/ Medical Decision Making/ A&P                             Medical Decision Making Amount and/or Complexity of Data Reviewed Labs: ordered. Radiology: ordered. ECG/medicine tests: ordered.  Risk Prescription drug management.   Certainly sounds like seizures and this being his second episode we will load him with Keppra.  No indication to recheck a head CT but will recheck labs again his orthostatic symptoms.  Will need neurology follow-up which he has in about a week. Workup reassuring. No more seizure like activity or abnormal behavior. Loaded with keppra, represcribed the same. Already has neuro follow up but will try to got on a cancellation list. Neuro intact and at baseline at time of discharge.   Final Clinical Impression(s) / ED Diagnoses Final diagnoses:  Seizure-like activity ([place])    Rx / DC Orders ED Discharge Orders          Ordered    levETIRAcetam (KEPPRA) 500 MG tablet  2 times daily        11/05/22 0721              Icyss Skog, Corene Cornea, MD 11/05/22 2323

## 2022-11-16 ENCOUNTER — Ambulatory Visit: Payer: Medicaid Other | Admitting: Neurology

## 2022-11-16 ENCOUNTER — Encounter: Payer: Self-pay | Admitting: Neurology

## 2022-11-16 VITALS — BP 134/75 | HR 73 | Ht 65.0 in | Wt 265.0 lb

## 2022-11-16 DIAGNOSIS — Z5181 Encounter for therapeutic drug level monitoring: Secondary | ICD-10-CM | POA: Diagnosis not present

## 2022-11-16 DIAGNOSIS — G40909 Epilepsy, unspecified, not intractable, without status epilepticus: Secondary | ICD-10-CM

## 2022-11-16 MED ORDER — LEVETIRACETAM 500 MG PO TABS: 500.0000 mg | ORAL_TABLET | Freq: Two times a day (BID) | ORAL | 11 refills | Status: DC

## 2022-11-16 NOTE — Progress Notes (Signed)
[facility]  PATIENT: Timothy Haynes DOB: 09/20/1990  REQUESTING CLINICIAN: Prosperi, Joesph Fillers, * HISTORY FROM: Patient and family  REASON FOR VISIT: New onset seizures    HISTORICAL  CHIEF COMPLAINT:  Chief Complaint  Patient presents with   New Patient (Initial Visit)    Rm 73, with mother  Reports no new seizure like episodes     HISTORY OF PRESENT ILLNESS:  This is a 32 year old gentleman with no reported past medical history who is presenting for new onset epilepsy.  Patient reports going to bed the night of January 31 and waking up on the floor with tongue biting.  He was confused, he presented to the ED and there was a suspicion for seizures.  He was recommended to start Keppra 500 mg twice daily and discharged home.  At that time he reports that he did not take the [place].  On February 23 he had another event witnessed by wife.  Again it was during sleep and wife woke up in bed shaking.  He was also making a grunting noise.  He did have tongue biting and the EMS was called.  Seizure was described as generalized convulsion. Wife reports patient was confused initially.  In the ED workup unrevealing, he was recommended Keppra 500 mg twice daily.  Patient reports since discharge from the hospital he has been compliant with the Keppra 500 mg twice daily, denies any seizure or seizure activity.  Denies any side effect from the medicine.  He denies any seizure risk factors, denies any family history of seizures.   Handedness Right handed    Onset: January 31  Seizure Type: generalized convulsion   Current frequency: 2 seizure reported   Any injuries from seizures: Tongue biting   Seizure risk factors: None   Previous ASMs: levetiracetam   Currenty ASMs: Levetiracetam 500 mg twice daily   ASMs side effects: None   Brain Images: Normal head CT   Previous EEGs: Not performed    OTHER MEDICAL CONDITIONS: None reported   REVIEW OF SYSTEMS: Full 14  system review of systems performed and negative with exception of: As noted in the HPI   ALLERGIES: No Known Allergies  HOME MEDICATIONS: Outpatient Medications Prior to Visit  Medication Sig Dispense Refill   cefadroxil (DURICEF) 500 MG capsule Take 1 capsule (500 mg total) by mouth 2 (two) times daily. 14 capsule 0   levETIRAcetam (KEPPRA) 500 MG tablet Take 1 tablet (500 mg total) by mouth 2 (two) times daily. 60 tablet 1   No facility-administered medications prior to visit.    PAST MEDICAL HISTORY: History reviewed. No pertinent past medical history.  PAST SURGICAL HISTORY: Past Surgical History:  Procedure Laterality Date   FOOT SURGERY      FAMILY HISTORY: Family History  Problem Relation Age of Onset   Hypertension Mother    Hypertension Father    Kidney disease Father    Diabetes Maternal Grandmother     SOCIAL HISTORY: Social History   Socioeconomic History   Marital status: Single    Spouse name: Not on file   Number of children: Not on file   Years of education: Not on file   Highest education level: Not on file  Occupational History   Not on file  Tobacco Use   Smoking status: Every Day    Packs/day: 0.50    Types: Cigarettes   Smokeless tobacco: Never  Substance and Sexual Activity   Alcohol use: Yes  Comment: occ   Drug use: No   Sexual activity: Yes    Comment: married and monagomous  Other Topics Concern   Not on file  Social History Narrative   Not on file   Social Determinants of Health   Financial Resource Strain: Not on file  Food Insecurity: Not on file  Transportation Needs: Not on file  Physical Activity: Not on file  Stress: Not on file  Social Connections: Not on file  Intimate Partner Violence: Not on file     PHYSICAL EXAM   GENERAL EXAM/CONSTITUTIONAL: Vitals:  Vitals:   11/16/22 1510  BP: 134/75  Pulse: 73  Weight: 265 lb (120.2 kg)  Height: '5\' 5"'$  (1.651 m)   Body mass index is 44.1 kg/m. Wt Readings  from Last 3 Encounters:  11/16/22 265 lb (120.2 kg)  11/05/22 265 lb (120.2 kg)  10/13/22 240 lb (108.9 kg)   Patient is in no distress; well developed, nourished and groomed; neck is supple  EYES: Visual fields full to confrontation, Extraocular movements intacts,  No results found.  MUSCULOSKELETAL: Gait, strength, tone, movements noted in Neurologic exam below  NEUROLOGIC: MENTAL STATUS:      No data to display         awake, alert, oriented to person, place and time recent and remote memory intact normal attention and concentration language fluent, comprehension intact, naming intact fund of knowledge appropriate  CRANIAL NERVE:  2nd, 3rd, 4th, 6th - Visual fields full to confrontation, extraocular muscles intact, no nystagmus 5th - facial sensation symmetric 7th - facial strength symmetric 8th - hearing intact 9th - palate elevates symmetrically, uvula midline 11th - shoulder shrug symmetric 12th - tongue protrusion midline  MOTOR:  normal bulk and tone, full strength in the BUE, BLE  SENSORY:  normal and symmetric to light touch  COORDINATION:  finger-nose-finger, fine finger movements normal  REFLEXES:  deep tendon reflexes present and symmetric  GAIT/STATION:  normal    DIAGNOSTIC DATA (LABS, IMAGING, TESTING) - I reviewed patient records, labs, notes, testing and imaging myself where available.  Lab Results  Component Value Date   WBC 9.0 11/05/2022   HGB 15.2 11/05/2022   HCT 46.4 11/05/2022   MCV 94.9 11/05/2022   PLT 171 11/05/2022      Component Value Date/Time   NA 131 (L) 11/05/2022 0404   K 3.9 11/05/2022 0625   CL 108 11/05/2022 0404   CO2 19 (L) 11/05/2022 0404   GLUCOSE 100 (H) 11/05/2022 0404   BUN 14 11/05/2022 0404   CREATININE 0.94 11/05/2022 0404   CREATININE 0.86 09/29/2012 1201   CALCIUM 7.8 (L) 11/05/2022 0404   PROT  11/05/2022 0404    SPECIMEN HEMOLYZED. HEMOLYSIS MAY AFFECT INTEGRITY OF RESULTS.   ALBUMIN 3.2  (L) 11/05/2022 0404   AST  11/05/2022 0404    SPECIMEN HEMOLYZED. HEMOLYSIS MAY AFFECT INTEGRITY OF RESULTS.   ALT  11/05/2022 0404    SPECIMEN HEMOLYZED. HEMOLYSIS MAY AFFECT INTEGRITY OF RESULTS.   ALKPHOS 44 11/05/2022 0404   BILITOT 0.6 11/05/2022 0404   GFRNONAA >60 11/05/2022 0404   GFRAA >60 06/06/2016 1815   Lab Results  Component Value Date   CHOL 124 02/06/2010   HDL 34 (L) 02/06/2010   LDLCALC 81 02/06/2010   TRIG 45 02/06/2010   No results found for: "HGBA1C" No results found for: "VITAMINB12" Lab Results  Component Value Date   TSH 3.444 02/06/2010    Head CT 10/13/2022 Unremarkable head CT with  no acute intracranial pathology.   I personally reviewed brain Images   ASSESSMENT AND PLAN  32 y.o. year old male  with no reported past medical history who is presenting with new onset epilepsy.  So far he has 2 event described as generalized convulsion with tongue biting.  He is on Keppra 500 mg twice daily and reports compliance with the medication.  While on the [place] he has not had any seizure.  For now, will check a Keppra level and we will continue patient on Keppra 500 mg twice daily.  We will also obtain a routine EEG. I did advise him to contact me if he does have a breakthrough seizure, plan at that time will be to increase the Keppra.  We discussed side effect of the medication, also discussed driving restriction for the next 6 months.  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  Again we discussed compliance of medication and we also discussed risk of SUDEP. I will see him in 3 months for follow-up or sooner if worse. I provided him with a return to work with modified duties letter to give to his employer.    1. Nonintractable epilepsy without status epilepticus, unspecified epilepsy type ([place])   2. Therapeutic drug monitoring     Patient Instructions  Continue  with Keppra 500 mg twice daily Will check a Keppra level today Routine EEG Discussed driving restriction for next 6 months Follow-up in 3 months or sooner if worse.   Per [facility] statutes, patients with seizures are not allowed to drive until they have been seizure-free for six months.  Other recommendations include using caution when using heavy equipment or power tools. Avoid working on ladders or at heights. Take showers instead of baths.  Do not swim alone.  Ensure the water temperature is not too high on the home water heater. Do not go swimming alone. Do not lock yourself in a room alone (i.e. bathroom). When caring for infants or small children, sit down when holding, feeding, or changing them to minimize risk of injury to the child in the event you have a seizure. Maintain good sleep hygiene. Avoid alcohol.  Also recommend adequate sleep, hydration, good diet and minimize stress.   Discussed Patients with epilepsy have a small risk of sudden unexpected death, a condition referred to as sudden unexpected death in epilepsy (SUDEP). SUDEP is defined specifically as the sudden, unexpected, witnessed or unwitnessed, nontraumatic and nondrowning death in patients with epilepsy with or without evidence for a seizure, and excluding documented status epilepticus, in which post mortem examination does not reveal a structural or toxicologic cause for death    During the Seizure  - First, ensure adequate ventilation and place patients on the floor on their left side  Loosen clothing around the neck and ensure the airway is patent. If the patient is clenching the teeth, do not force the mouth open with any object as this can cause severe damage - Remove all items from the surrounding that can be hazardous. The patient may be oblivious to what's happening and may not even know what he or she is doing. If the patient is confused and wandering, either gently guide him/her away and block access  to outside areas - Reassure the individual and be comforting - Call 911. In most cases, the seizure ends before EMS arrives. However, there  are cases when seizures may last over 3 to 5 minutes. Or the individual may have developed breathing difficulties or severe injuries. If a pregnant patient or a person with diabetes develops a seizure, it is prudent to call an ambulance. - Finally, if the patient does not regain full consciousness, then call EMS. Most patients will remain confused for about 45 to 90 minutes after a seizure, so you must use judgment in calling for help. - Avoid restraints but make sure the patient is in a bed with padded side rails - Place the individual in a lateral position with the neck slightly flexed; this will help the saliva drain from the mouth and prevent the tongue from falling backward - Remove all nearby furniture and other hazards from the area - Provide verbal assurance as the individual is regaining consciousness - Provide the patient with privacy if possible - Call for help and start treatment as ordered by the caregiver   After the Seizure (Postictal Stage)  After a seizure, most patients experience confusion, fatigue, muscle pain and/or a headache. Thus, one should permit the individual to sleep. For the next few days, reassurance is essential. Being calm and helping reorient the person is also of importance.  Most seizures are painless and end spontaneously. Seizures are not harmful to others but can lead to complications such as stress on the lungs, brain and the heart. Individuals with prior lung problems may develop labored breathing and respiratory distress.     Orders Placed This Encounter  Procedures   Levetiracetam level   EEG adult    Meds ordered this encounter  Medications   DISCONTD: levETIRAcetam (KEPPRA) 500 MG tablet    Sig: Take 1 tablet (500 mg total) by mouth 2 (two) times daily.    Dispense:  60 tablet    Refill:  11    levETIRAcetam (KEPPRA) 500 MG tablet    Sig: Take 1 tablet (500 mg total) by mouth 2 (two) times daily.    Dispense:  60 tablet    Refill:  11    Return in about 3 months (around 02/16/2023).    Alric Ran, MD 11/16/2022, 8:13 PM  [facility] [address] [place], [place] [address] [phone]

## 2022-11-16 NOTE — Patient Instructions (Signed)
Continue with Keppra 500 mg twice daily Will check a Keppra level today Routine EEG Discussed driving restriction for next 6 months Follow-up in 3 months or sooner if worse.

## 2022-11-18 LAB — LEVETIRACETAM LEVEL: Levetiracetam Lvl: 7.6 ug/mL — ABNORMAL LOW (ref 10.0–40.0)

## 2022-11-24 ENCOUNTER — Ambulatory Visit: Payer: Medicaid Other | Admitting: Neurology

## 2022-11-24 DIAGNOSIS — G40909 Epilepsy, unspecified, not intractable, without status epilepticus: Secondary | ICD-10-CM | POA: Diagnosis not present

## 2022-11-24 DIAGNOSIS — Z5181 Encounter for therapeutic drug level monitoring: Secondary | ICD-10-CM

## 2022-11-24 NOTE — Procedures (Signed)
    History:  32 year old man with seizures   EEG classification: Awake and drowsy  Description of the recording: The background rhythms of this recording consists of a fairly well modulated medium amplitude alpha rhythm of 11 Hz that is reactive to eye opening and closure. Present in the anterior head region is a 15-20 Hz beta activity. Photic stimulation was performed, did not show any abnormalities. Hyperventilation was also performed, did not show any abnormalities. Drowsiness was manifested by background fragmentation. There were frequent left temporal sharp and slow wave discharges seen during this recording. There was no focal slowing. There were no electrographic seizure identified.   Abnormality: Frequent left temporal sharp and slow wave discharges  Impression: This is an abnormal EEG recorded while drowsy and awake due to presence of left temporal sharp and slow wave discharges. This is consistent with an area of increase epileptogenic potential in the left temporal region.       Alric Ran, MD [facility]

## 2022-12-13 ENCOUNTER — Telehealth: Payer: Self-pay | Admitting: Neurology

## 2022-12-13 NOTE — Telephone Encounter (Signed)
Pt wife called. Requesting a letter form Dr. April Manson stating pt has epilepsy.

## 2022-12-20 ENCOUNTER — Other Ambulatory Visit: Payer: Self-pay | Admitting: Neurology

## 2022-12-20 MED ORDER — OXCARBAZEPINE 300 MG PO TABS: 300.0000 mg | ORAL_TABLET | Freq: Two times a day (BID) | ORAL | 11 refills | Status: DC

## 2022-12-20 NOTE — Telephone Encounter (Signed)
Called and spoke to wife on dpr, informing them of Dr. Karie Georges note and she stated that the letter was for disability, I advised that we typically just fill out forms from the agency itself and not write letters. She states she will get paperwork and reach back out. She had no further questions at this time but encouraged to call back if needed.

## 2022-12-20 NOTE — Telephone Encounter (Signed)
Pt's wife called stating she is needing to speak to provider regarding the letter that is needed stating that pt has Epilepsy. She also needs to discuss the mood swings he has been having. She states provider informed her to call to report. She states he is angry everyday,  wanting to be alone, throws things, takes things out on the kids and wife, verbally abusive. Please advise.

## 2022-12-20 NOTE — Telephone Encounter (Signed)
Please inform patient/wife that I have send them a new medication called Trileptal. Once they pick it up from the pharmacy, have them stop the Keppra and start Trileptal. Please clarify the letter that they need.

## 2022-12-30 ENCOUNTER — Emergency Department (HOSPITAL_COMMUNITY): Payer: Medicaid Other

## 2022-12-30 ENCOUNTER — Telehealth: Payer: Self-pay | Admitting: Neurology

## 2022-12-30 ENCOUNTER — Observation Stay (HOSPITAL_COMMUNITY)
Admission: EM | Admit: 2022-12-30 | Discharge: 2022-12-31 | Disposition: A | Discharge status: Home / Self Care | Payer: Medicaid Other | Attending: Family Medicine | Admitting: Family Medicine

## 2022-12-30 ENCOUNTER — Other Ambulatory Visit: Payer: Self-pay

## 2022-12-30 ENCOUNTER — Inpatient Hospital Stay (HOSPITAL_COMMUNITY): Payer: Medicaid Other

## 2022-12-30 ENCOUNTER — Encounter (HOSPITAL_COMMUNITY): Payer: Self-pay

## 2022-12-30 DIAGNOSIS — F17210 Nicotine dependence, cigarettes, uncomplicated: Secondary | ICD-10-CM | POA: Diagnosis present

## 2022-12-30 DIAGNOSIS — Z8249 Family history of ischemic heart disease and other diseases of the circulatory system: Secondary | ICD-10-CM

## 2022-12-30 DIAGNOSIS — G40919 Epilepsy, unspecified, intractable, without status epilepticus: Secondary | ICD-10-CM | POA: Diagnosis not present

## 2022-12-30 DIAGNOSIS — Z841 Family history of disorders of kidney and ureter: Secondary | ICD-10-CM

## 2022-12-30 DIAGNOSIS — S0003XA Contusion of scalp, initial encounter: Secondary | ICD-10-CM | POA: Diagnosis present

## 2022-12-30 DIAGNOSIS — R7309 Other abnormal glucose: Secondary | ICD-10-CM | POA: Insufficient documentation

## 2022-12-30 DIAGNOSIS — Z833 Family history of diabetes mellitus: Secondary | ICD-10-CM

## 2022-12-30 DIAGNOSIS — G40909 Epilepsy, unspecified, not intractable, without status epilepticus: Principal | ICD-10-CM

## 2022-12-30 DIAGNOSIS — W1839XA Other fall on same level, initial encounter: Secondary | ICD-10-CM | POA: Diagnosis present

## 2022-12-30 DIAGNOSIS — Y92009 Unspecified place in unspecified non-institutional (private) residence as the place of occurrence of the external cause: Secondary | ICD-10-CM

## 2022-12-30 DIAGNOSIS — R569 Unspecified convulsions: Principal | ICD-10-CM | POA: Insufficient documentation

## 2022-12-30 DIAGNOSIS — Z79899 Other long term (current) drug therapy: Secondary | ICD-10-CM

## 2022-12-30 DIAGNOSIS — G40409 Other generalized epilepsy and epileptic syndromes, not intractable, without status epilepticus: Principal | ICD-10-CM | POA: Diagnosis present

## 2022-12-30 HISTORY — DX: Unspecified convulsions: R56.9

## 2022-12-30 LAB — CBC WITH DIFFERENTIAL/PLATELET
Abs Immature Granulocytes: 0.06 10*3/uL (ref 0.00–0.07)
Basophils Absolute: 0 10*3/uL (ref 0.0–0.1)
Basophils Relative: 0 %
Eosinophils Absolute: 0 10*3/uL (ref 0.0–0.5)
Eosinophils Relative: 0 %
HCT: 49.6 % (ref 39.0–52.0)
Hemoglobin: 16.2 g/dL (ref 13.0–17.0)
Immature Granulocytes: 0 %
Lymphocytes Relative: 12 %
Lymphs Abs: 1.8 10*3/uL (ref 0.7–4.0)
MCH: 30.7 pg (ref 26.0–34.0)
MCHC: 32.7 g/dL (ref 30.0–36.0)
MCV: 94.1 fL (ref 80.0–100.0)
Monocytes Absolute: 0.7 10*3/uL (ref 0.1–1.0)
Monocytes Relative: 5 %
Neutro Abs: 12.6 10*3/uL — ABNORMAL HIGH (ref 1.7–7.7)
Neutrophils Relative %: 83 %
Platelets: 232 10*3/uL (ref 150–400)
RBC: 5.27 MIL/uL (ref 4.22–5.81)
RDW: 13.5 % (ref 11.5–15.5)
WBC: 15.1 10*3/uL — ABNORMAL HIGH (ref 4.0–10.5)
nRBC: 0 % (ref 0.0–0.2)

## 2022-12-30 LAB — COMPREHENSIVE METABOLIC PANEL
ALT: 38 U/L (ref 0–44)
AST: 31 U/L (ref 15–41)
Albumin: 4.2 g/dL (ref 3.5–5.0)
Alkaline Phosphatase: 68 U/L (ref 38–126)
Anion gap: 17 — ABNORMAL HIGH (ref 5–15)
BUN: 10 mg/dL (ref 6–20)
CO2: 16 mmol/L — ABNORMAL LOW (ref 22–32)
Calcium: 9.6 mg/dL (ref 8.9–10.3)
Chloride: 109 mmol/L (ref 98–111)
Creatinine, Ser: 1.22 mg/dL (ref 0.61–1.24)
GFR, Estimated: >60 mL/min (ref >60)
Glucose, Bld: 127 mg/dL — ABNORMAL HIGH (ref 70–99)
Potassium: 4.1 mmol/L (ref 3.5–5.1)
Sodium: 142 mmol/L (ref 135–145)
Total Bilirubin: 0.5 mg/dL (ref 0.3–1.2)
Total Protein: 7.2 g/dL (ref 6.5–8.1)

## 2022-12-30 LAB — CBG MONITORING, ED: Glucose-Capillary: 128 mg/dL — ABNORMAL HIGH (ref 70–99)

## 2022-12-30 MED ORDER — OXCARBAZEPINE 300 MG PO TABS
600.0000 mg | ORAL_TABLET | Freq: Two times a day (BID) | ORAL | Status: DC
  Administered 2022-12-31: 600 mg via ORAL
  Filled 2022-12-30: qty 2

## 2022-12-30 MED ORDER — ACETAMINOPHEN 650 MG RE SUPP: 650.0000 mg | Freq: Four times a day (QID) | RECTAL | Status: DC | PRN

## 2022-12-30 MED ORDER — ENOXAPARIN SODIUM 60 MG/0.6ML IJ SOSY: 50.0000 mg | PREFILLED_SYRINGE | INTRAMUSCULAR | Status: DC

## 2022-12-30 MED ORDER — GADOBUTROL 1 MMOL/ML IV SOLN
10.0000 mL | Freq: Once | INTRAVENOUS | Status: AC | PRN
  Administered 2022-12-30: 10 mL via INTRAVENOUS

## 2022-12-30 MED ORDER — VALPROATE SODIUM 100 MG/ML IV SOLN
2000.0000 mg | Freq: Once | INTRAVENOUS | Status: AC
  Administered 2022-12-30: 2000 mg via INTRAVENOUS
  Filled 2022-12-30: qty 20

## 2022-12-30 MED ORDER — LEVETIRACETAM IN NACL 1000 MG/100ML IV SOLN: 1000.0000 mg | Freq: Once | INTRAVENOUS | Status: DC

## 2022-12-30 MED ORDER — LORAZEPAM 2 MG/ML IJ SOLN: 2.0000 mg | Freq: Once | INTRAMUSCULAR | Status: DC | PRN

## 2022-12-30 MED ORDER — OXCARBAZEPINE 300 MG PO TABS
600.0000 mg | ORAL_TABLET | Freq: Once | ORAL | Status: AC
  Administered 2022-12-30: 600 mg via ORAL
  Filled 2022-12-30: qty 2

## 2022-12-30 MED ORDER — ACETAMINOPHEN 325 MG PO TABS: 650.0000 mg | ORAL_TABLET | Freq: Four times a day (QID) | ORAL | Status: DC | PRN

## 2022-12-30 MED ORDER — ACETAMINOPHEN 325 MG PO TABS
650.0000 mg | ORAL_TABLET | Freq: Once | ORAL | Status: AC
  Administered 2022-12-30: 650 mg via ORAL
  Filled 2022-12-30: qty 2

## 2022-12-30 NOTE — ED Provider Notes (Signed)
[place] EMERGENCY DEPARTMENT AT [facility] Provider Note   CSN: [id number] Arrival date & time: 12/30/22  1440     History  Chief Complaint  Patient presents with   Seizures    Timothy Haynes is a 32 y.o. male.  Patient here after breakthrough seizures.  Seems like he had 2 seizure episodes.  Just switched from Keppra to Trileptal about 10 days ago.  They were increasing his seizure medicine at his neurology office with Keppra but he started to have some mood issues with that and he decided to start him on Trileptal.  He had not had seizures since having a seizure 2 months ago.  He had had any seizures while taking Keppra.  Overall new seizure diagnosis here in the last few months.  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  He has yet to get an MRI.  He is back at his baseline.  He has been sleeping well.  He is adamant that he is taking his meds.  Denies any chest pain or shortness of breath.  He did hit his head pretty hard on the fall.  May of hit a dumbbell.  Family states he has a hematoma to the back of his head.  The history is provided by the patient.       Home Medications Prior to Admission medications   Medication Sig Start Date End Date Taking? Authorizing Provider  Oxcarbazepine (TRILEPTAL) 300 MG tablet Take 1 tablet (300 mg total) by mouth 2 (two) times daily. 12/20/22 12/15/23 Yes Windell Norfolk, MD  cefadroxil (DURICEF) 500 MG capsule Take 1 capsule (500 mg total) by mouth 2 (two) times daily. Patient not taking: Reported on 12/30/2022 10/13/22   Prosperi, Christian H, PA-C  levETIRAcetam (KEPPRA) 500 MG tablet Take 1 tablet (500 mg total) by mouth 2 (two) times daily. Patient not taking: Reported on 12/30/2022 11/16/22   Windell Norfolk, MD      Allergies    Patient has no known allergies.    Review of Systems   Review of Systems  Physical Exam Updated Vital Signs BP (!) 150/71   Pulse 92   Temp 98 F (36.7 C) (Oral)   Resp 17    SpO2 95%  Physical Exam Vitals and nursing note reviewed.  Constitutional:      General: He is not in acute distress.    Appearance: He is well-developed. He is not ill-appearing.  HENT:     Head:     Comments: Hematoma to the occiput and frontal    Nose: Nose normal.     Mouth/Throat:     Mouth: Mucous membranes are moist.  Eyes:     Extraocular Movements: Extraocular movements intact.     Conjunctiva/sclera: Conjunctivae normal.     Pupils: Pupils are equal, round, and reactive to light.  Cardiovascular:     Rate and Rhythm: Normal rate and regular rhythm.     Heart sounds: No murmur heard. Pulmonary:     Effort: Pulmonary effort is normal. No respiratory distress.     Breath sounds: Normal breath sounds.  Abdominal:     General: Abdomen is flat.     Palpations: Abdomen is soft.     Tenderness: There is no abdominal tenderness.  Musculoskeletal:        General: No swelling.     Cervical back: Neck supple.  Skin:    General: Skin is warm and dry.     Capillary Refill:  Capillary refill takes less than 2 seconds.  Neurological:     General: No focal deficit present.     Mental Status: He is alert and oriented to person, place, and time.     Cranial Nerves: No cranial nerve deficit.     Sensory: No sensory deficit.     Motor: No weakness.     Coordination: Coordination normal.     Comments: 5+ out of 5 strength throughout, normal sensation, no drift, normal finger-nose-finger, normal speech  Psychiatric:        Mood and Affect: Mood normal.     ED Results / Procedures / Treatments   Labs (all labs ordered are listed, but only abnormal results are displayed) Labs Reviewed  COMPREHENSIVE METABOLIC PANEL - Abnormal; Notable for the following components:      Result Value   CO2 16 (*)    Glucose, Bld 127 (*)    Anion gap 17 (*)    All other components within normal limits  CBC WITH DIFFERENTIAL/PLATELET - Abnormal; Notable for the following components:   WBC 15.1  (*)    Neutro Abs 12.6 (*)    All other components within normal limits  CBG MONITORING, ED - Abnormal; Notable for the following components:   Glucose-Capillary 128 (*)    All other components within normal limits  10-HYDROXYCARBAZEPINE    EKG EKG Interpretation  Date/Time:  Thursday December 30 2022 14:45:33 EDT Ventricular Rate:  80 PR Interval:  184 QRS Duration: 94 QT Interval:  371 QTC Calculation: 428 R Axis:   69 Text Interpretation: Sinus rhythm Confirmed by Virgina Norfolk ([id number]) on 12/30/2022 3:40:04 PM  Radiology MR BRAIN WO CONTRAST  Result Date: 12/30/2022 CLINICAL DATA:  New onset seizure EXAM: MRI HEAD WITHOUT CONTRAST TECHNIQUE: Multiplanar, multiecho pulse sequences of the brain and surrounding structures were obtained without intravenous contrast. COMPARISON:  None Available. FINDINGS: Postcontrast imaging could not be obtained, as the patient had a seizure in the scanner a few minutes after receiving contrast, at which point the study was terminated. Brain: The hippocampi are symmetric in size and signal. No heterotopia or evidence of cortical dysgenesis. No restricted diffusion to suggest acute or subacute infarct. No acute hemorrhage, mass, mass effect, or midline shift. No hydrocephalus or extra-axial collection. Normal pituitary and craniocervical junction. No hemosiderin deposition to suggest remote hemorrhage. Vascular: Normal arterial flow voids. Skull and upper cervical spine: Normal marrow signal. Sinuses/Orbits: Overall clear paranasal sinuses. No acute finding in the orbits. Dysconjugate gaze. Other: The mastoid air cells are well aerated. IMPRESSION: No acute intracranial process. No seizure etiology is identified. Electronically Signed   By: Wiliam Ke M.D.   On: 12/30/2022 19:47   CT Head Wo Contrast  Result Date: 12/30/2022 CLINICAL DATA:  Seizure EXAM: CT HEAD WITHOUT CONTRAST CT CERVICAL SPINE WITHOUT CONTRAST TECHNIQUE: Multidetector CT imaging of the  head and cervical spine was performed following the standard protocol without intravenous contrast. Multiplanar CT image reconstructions of the cervical spine were also generated. RADIATION DOSE REDUCTION: This exam was performed according to the departmental dose-optimization program which includes automated exposure control, adjustment of the mA and/or kV according to patient size and/or use of iterative reconstruction technique. COMPARISON:  10/13/2022 CT head, 06/06/2016 CT cervical spine FINDINGS: CT HEAD FINDINGS Evaluation is somewhat limited by motion artifact. Brain: No evidence of acute infarct, hemorrhage, mass, mass effect, or midline shift. No hydrocephalus or extra-axial fluid collection. Arachnoid cyst in the medial right posterior fossa. Vascular: No hyperdense  vessel. Skull: Negative for fracture or focal lesion. Sinuses/Orbits: No acute finding. Other: The mastoid air cells are well aerated. CT CERVICAL SPINE FINDINGS Alignment: No traumatic listhesis. Straightening of the normal cervical lordosis. Skull base and vertebrae: No acute fracture. No primary bone lesion or focal pathologic process. Soft tissues and spinal canal: No prevertebral fluid or swelling. No visible canal hematoma. Disc levels: Degenerative changes in the cervical spine. No significant spinal canal stenosis. Upper chest: Negative. IMPRESSION: 1. Evaluation is somewhat limited by motion artifact. Within this limitation, no acute intracranial process. 2. No acute fracture or traumatic listhesis in the cervical spine. Electronically Signed   By: Wiliam Ke M.D.   On: 12/30/2022 16:37   CT Cervical Spine Wo Contrast  Result Date: 12/30/2022 CLINICAL DATA:  Seizure EXAM: CT HEAD WITHOUT CONTRAST CT CERVICAL SPINE WITHOUT CONTRAST TECHNIQUE: Multidetector CT imaging of the head and cervical spine was performed following the standard protocol without intravenous contrast. Multiplanar CT image reconstructions of the cervical  spine were also generated. RADIATION DOSE REDUCTION: This exam was performed according to the departmental dose-optimization program which includes automated exposure control, adjustment of the mA and/or kV according to patient size and/or use of iterative reconstruction technique. COMPARISON:  10/13/2022 CT head, 06/06/2016 CT cervical spine FINDINGS: CT HEAD FINDINGS Evaluation is somewhat limited by motion artifact. Brain: No evidence of acute infarct, hemorrhage, mass, mass effect, or midline shift. No hydrocephalus or extra-axial fluid collection. Arachnoid cyst in the medial right posterior fossa. Vascular: No hyperdense vessel. Skull: Negative for fracture or focal lesion. Sinuses/Orbits: No acute finding. Other: The mastoid air cells are well aerated. CT CERVICAL SPINE FINDINGS Alignment: No traumatic listhesis. Straightening of the normal cervical lordosis. Skull base and vertebrae: No acute fracture. No primary bone lesion or focal pathologic process. Soft tissues and spinal canal: No prevertebral fluid or swelling. No visible canal hematoma. Disc levels: Degenerative changes in the cervical spine. No significant spinal canal stenosis. Upper chest: Negative. IMPRESSION: 1. Evaluation is somewhat limited by motion artifact. Within this limitation, no acute intracranial process. 2. No acute fracture or traumatic listhesis in the cervical spine. Electronically Signed   By: Wiliam Ke M.D.   On: 12/30/2022 16:37    Procedures Procedures    Medications Ordered in ED Medications  Oxcarbazepine (TRILEPTAL) tablet 600 mg (has no administration in time range)  valproate (DEPACON) 2,000 mg in dextrose 5 % 50 mL IVPB (has no administration in time range)  acetaminophen (TYLENOL) tablet 650 mg (650 mg Oral Given 12/30/22 1637)  Oxcarbazepine (TRILEPTAL) tablet 600 mg (600 mg Oral Given 12/30/22 1709)  gadobutrol (GADAVIST) 1 MMOL/ML injection 10 mL (10 mLs Intravenous Contrast Given 12/30/22 1901)    ED  Course/ Medical Decision Making/ A&P                             Medical Decision Making Amount and/or Complexity of Data Reviewed Labs: ordered. Radiology: ordered.  Risk OTC drugs. Prescription drug management. Decision regarding hospitalization.   MAYAN KLOEPFER is here with breakthrough seizures.  Recent diagnosis of seizures.  Recently had his Keppra switched to Trileptal as he was having some mood swings on Keppra.  He is on 300 mg twice daily.  Patient arrives overall unremarkable vitals.  Had 2 episodes of tonic-clonic type seizures.  He is back at his baseline but is a little bit sleepy still.  Has not been sleeping well.  He does  say he has been taking his medications.  Talked with Dr. Otelia Limes as well as his outpatient neurologist Dr. Teresa Coombs, we will get MRI with and without contrast to complete a seizure workup.  He has had an EEG thus far that does show that he likely has a primary seizure disorder.  Will adjust his medications to 600 twice daily of Trileptal and send the level.  Basic labs thus far obtained showed no significant anemia or electrolyte abnormality or kidney injury.  Overall likely breakthrough seizure in the setting of poor sleep and titrating of new medications.  Awaiting MRI and anticipate discharge if patient remains stable.  Patient had another seizure episode while he was in MRI.  He was able to get MRI without contrast which was unremarkable.  Upon my evaluation of him he was already postictal and seizure had stopped.  I talked with Dr. Wilford Corner with neurology who came down to the ED to evaluate the patient.  He will load the patient with Depakote and recommends LTM and admission.  CT scan of the head and neck from a traumatic standpoint were unremarkable.  Lab work per my review and interpretation showed no significant anemia or electrolyte abnormality or kidney injury or leukocytosis.  Will admit him to further optimize his seizure medications.  This chart was  dictated using voice recognition software.  Despite best efforts to proofread,  errors can occur which can change the documentation meaning.         Final Clinical Impression(s) / ED Diagnoses Final diagnoses:  Seizure disorder    Rx / DC Orders ED Discharge Orders     None         Virgina Norfolk, DO 12/30/22 2024

## 2022-12-30 NOTE — Consult Note (Signed)
Neurology Consultation  Reason for Consult: seizures Referring Physician: Dr Loletha Carrow  CC: Seizures  History is obtained from: Chart, patient's wife  HPI: Timothy Haynes is a 32 y.o. male with a recent diagnosis of epilepsy, initially started on Keppra, changed to oxcarbazepine, brought in for 2 seizures at home and had another seizure at the MRI table in the emergency room while undergoing evaluation. Patient started having seizures for the first time in February.  No prior history of head injury or seizures.  Reported for seizure was - January 31 where he was found waking up on the floor after having gone to bed with tongue bite.  He was confused and presented to the ED with suspicion of seizures.  Recommended to start Keppra 500 twice daily and discharged home.  On five 3/23 had another event witnessed by wife-again in sleep, wife woke up in bed and he was shaking and making grunting noises.  He did have a tongue bite and EMS was called.  Seizure was described as generalized tonic-clonic seizure.  He was initially confused and then came around slowly.  In the ED workup was unrevealing and was discharged home back on Keppra.  At some point Keppra was increased.  He started having mood swings and it was changed to oxcarbazepine. He follows with Dr. Teresa Coombs at [facility] neurology EEG done at [facility] neurology revealed frequent left sharp and slow wave discharges in the temporal region consistent with area of epileptogenicity.   Today, he was at home, had a seizure and after that was able to call family and let them know that he had a seizure because there was blood on the floor and he did not remember the event.  He had another episode witnessed by family.  He was brought into the ER.  Taken for MRI and had another seizure on the table.   Reports compliance to medications.  ROS: Full ROS was performed and is negative except as noted in the HPI.   Past Medical History:  Diagnosis Date    Seizures      Family History  Problem Relation Age of Onset   Hypertension Mother    Hypertension Father    Kidney disease Father    Diabetes Maternal Grandmother     Social History:   reports that he has been smoking cigarettes. He has been smoking an average of .5 packs per day. He has never used smokeless tobacco. He reports current alcohol use. He reports that he does not use drugs.  Medications  Current Facility-Administered Medications:    [START ON 12/31/2022] Oxcarbazepine (TRILEPTAL) tablet 600 mg, 600 mg, Oral, BID, Curatolo, Adam, DO  Current Outpatient Medications:    Oxcarbazepine (TRILEPTAL) 300 MG tablet, Take 1 tablet (300 mg total) by mouth 2 (two) times daily., Disp: 60 tablet, Rfl: 11   cefadroxil (DURICEF) 500 MG capsule, Take 1 capsule (500 mg total) by mouth 2 (two) times daily. (Patient not taking: Reported on 12/30/2022), Disp: 14 capsule, Rfl: 0   levETIRAcetam (KEPPRA) 500 MG tablet, Take 1 tablet (500 mg total) by mouth 2 (two) times daily. (Patient not taking: Reported on 12/30/2022), Disp: 60 tablet, Rfl: 11  Exam: Current vital signs: BP (!) 182/88   Pulse (!) 58   Temp 98 F (36.7 C) (Oral)   Resp 16   SpO2 99%  Vital signs in last 24 hours: Temp:  [97.6 F (36.4 C)-98 F (36.7 C)] 98 F (36.7 C) (04/18 1831) Pulse Rate:  [58-80]  58 (04/18 1800) Resp:  [16-24] 16 (04/18 1800) BP: (136-182)/(88-104) 182/88 (04/18 1800) SpO2:  [94 %-100 %] 99 % (04/18 1800) General somewhat drowsy but able to participate in the exam HEENT: Normocephalic, atraumatic, lateral tongue bites bilaterally Lungs clear Cardiovascular: Regular rhythm Abdomen nondistended nontender Neurologic exam Somewhat drowsy but able to participate in the exam Mildly dysarthric speech No aphasia Poor attention concentration Cranials 2-12 intact Motor examination with no drift Sensation intact light touch Coordination exam reveals no gross dysmetria.  Labs I have reviewed  labs in epic and the results pertinent to this consultation are: Leukocytosis likely reactive. CBC    Component Value Date/Time   WBC 15.1 (H) 12/30/2022 1502   RBC 5.27 12/30/2022 1502   HGB 16.2 12/30/2022 1502   HCT 49.6 12/30/2022 1502   PLT 232 12/30/2022 1502   MCV 94.1 12/30/2022 1502   MCH 30.7 12/30/2022 1502   MCHC 32.7 12/30/2022 1502   RDW 13.5 12/30/2022 1502   LYMPHSABS 1.8 12/30/2022 1502   MONOABS 0.7 12/30/2022 1502   EOSABS 0.0 12/30/2022 1502   BASOSABS 0.0 12/30/2022 1502    CMP     Component Value Date/Time   NA 142 12/30/2022 1502   K 4.1 12/30/2022 1502   CL 109 12/30/2022 1502   CO2 16 (L) 12/30/2022 1502   GLUCOSE 127 (H) 12/30/2022 1502   BUN 10 12/30/2022 1502   CREATININE 1.22 12/30/2022 1502   CREATININE 0.86 09/29/2012 1201   CALCIUM 9.6 12/30/2022 1502   PROT 7.2 12/30/2022 1502   ALBUMIN 4.2 12/30/2022 1502   AST 31 12/30/2022 1502   ALT 38 12/30/2022 1502   ALKPHOS 68 12/30/2022 1502   BILITOT 0.5 12/30/2022 1502   GFRNONAA >60 12/30/2022 1502   GFRAA >60 06/06/2016 1815    Imaging I have reviewed the images obtained:  MRI brain without contrast: Normal CT head and CT C-spine: No acute abnormality with limited exam due to motion artifact  Assessment:  32 year old man with relatively new onset epilepsy in February who did not tolerate Keppra well and was changed to oxcarbazepine presents today with 2 seizures at home and another in the emergency room while undergoing evaluation. Unclear what might have provoked or lowered his seizure threshold. 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 Due to multiplicity of seizures, I would recommend observing him overnight  Impression: Breakthrough seizures  Recommendations: Continue oxcarbazepine 600 mg twice daily p.o. In addition, I would recommend giving him a Depakote load 2000 mg x1.  Will avoid Keppra given prior history  of mood swings and intolerance. Hook up to LTM EEG Final recs on AEDs based on clinical and EEG finnings. Seizure precautions Check UA, chest x-ray Check toxicology screen Plan discussed with Dr. Lockie Mola Neurology will follow  -- Milon Dikes, MD Neurologist [facility] Pager: [phone]   SEIZURE PRECAUTIONS Per [facility] statutes, patients with seizures are not allowed to drive until they have been seizure-free for six months.   Use caution when using heavy equipment or power tools. Avoid working on ladders or at heights. Take showers instead of baths. Ensure the water temperature is not too high on the home water heater. Do not go swimming alone. Do not lock yourself in a room alone (i.e. bathroom). When caring for infants or small children, sit down when holding, feeding, or changing them to minimize risk of injury to the child in the event you have a seizure. Maintain  good sleep hygiene. Avoid alcohol.    If patient has another seizure, call 911 and bring them back to the ED if: A.  The seizure lasts longer than 5 minutes.      B.  The patient doesn't wake shortly after the seizure or has new problems such as difficulty seeing, speaking or moving following the seizure C.  The patient was injured during the seizure D.  The patient has a temperature over 102 F (39C) E.  The patient vomited during the seizure and now is having trouble breathing

## 2022-12-30 NOTE — Progress Notes (Signed)
   12/30/22 2000  Spiritual Encounters  Type of Visit Initial  Care provided to: Patient;Family  Conversation partners present during encounter Nurse  Referral source Code page  Reason for visit Code  OnCall Visit Yes   Ch responded to rapid response page. Pt's family was at bedside. Family member was worrying about patient and wanted information. Ch reassured her the doctor will soon inform her of the situation. Ch provided reflective listening and lessened her anxiety. No follow-up needed at this time

## 2022-12-30 NOTE — ED Triage Notes (Signed)
Hx of seizures. Recent change in seizure meds a week ago. 2 seizure episodes with one at noon witnessed by family. 2nd seizure episode while sitting on the couch. No fall/injuries. Pt is incontinent of urine. Post ictal currently.

## 2022-12-30 NOTE — Significant Event (Signed)
Rapid Response Event Note   Reason for Call : Seizure   Initial Focused Assessment:  Called to MRI after pt seized during exam. Pt bit his tongue during seizure. He is arousable. VSS  1930-HR 109, 136/59, RR 22, sats 93% on RA     Interventions:  -Transported back to room 24        MD Notified: Dr. Lockie Mola at bedside Call Time: 1924 Arrival Time: 1928 End Time: 1943  Rose Fillers, RN

## 2022-12-30 NOTE — Telephone Encounter (Signed)
Pt spouse called. Stated pt is taking Oxcarbazepine (TRILEPTAL) 300 MG tablet. Stated pt had a seizure this morning after taking medication, Stated pt was home alone with the baby and she doesn't;know how long seizure lasted. She is requesting a call back from nurse to discuss.

## 2022-12-30 NOTE — ED Notes (Signed)
EEG at bedside.

## 2022-12-30 NOTE — Progress Notes (Signed)
LTM EEG hooked up and running - no initial skin breakdown - push button tested  In ER not monitored by [facility]

## 2022-12-30 NOTE — ED Notes (Signed)
Taken to MRI by transporter.  °

## 2022-12-30 NOTE — Telephone Encounter (Signed)
Called spouse back and patient. While on the phone, spouse began telling me he was having another seizure and they were taking him to the ER and would call back when she knew more.

## 2022-12-30 NOTE — H&P (Signed)
PCP:   Patient, No Pcp Per   Chief Complaint:  Seizures  HPI: This is a 32 year old male that was diagnosed with seizures 10/03/2022, discharged on Keppra with neurology follow-up.  Today wife at work, received a call around 47 AM telling her that he thought he had a seizure, he woke up with blood on the floor.  When she arrived home, he was out of it and his tongue was bleeding.  10 to 15 minutes after her arrival he had a grand mal seizure, this resulted in her taking him to the ER.  In the ER patient taken to MRI, had a seizure in MRI. Patient's Keppra was recently discontinued as it was causing mood swings.  This was changed to oxcarbazepine 300 mg p.o. twice daily.  Patient has been compliant with his medication.  Patient DC'd Keppra once oxcarbazepine started.  The patient has had no recent illnesses, no fever no chills, nausea, no vomiting, shortness of breath.  He denies any new or change in medications except as noted above.  No over-the-counter medication.  In the ER, neurology was consulted and has seen patient.  He has been loaded with valproic acid.  Admission requested.  Provided by wife present at bedside.  Patient awake, alert and oriented but sleepy.  Review of Systems:  The patient denies anorexia, fever, weight loss,, vision loss, decreased hearing, hoarseness, chest pain, syncope, dyspnea on exertion, peripheral edema, balance deficits, hemoptysis, abdominal pain, melena, hematochezia, severe indigestion/heartburn, hematuria, incontinence, genital sores, muscle weakness, suspicious skin lesions, transient blindness, difficulty walking, depression, unusual weight change, abnormal bleeding, enlarged lymph nodes, angioedema, and breast masses. Positives: Seizures  Past Medical History: Past Medical History:  Diagnosis Date   Seizures    Past Surgical History:  Procedure Laterality Date   FOOT SURGERY      Medications: Prior to Admission medications   Medication Sig Start  Date End Date Taking? Authorizing Provider  Oxcarbazepine (TRILEPTAL) 300 MG tablet Take 1 tablet (300 mg total) by mouth 2 (two) times daily. 12/20/22 12/15/23 Yes Windell Norfolk, MD  cefadroxil (DURICEF) 500 MG capsule Take 1 capsule (500 mg total) by mouth 2 (two) times daily. Patient not taking: Reported on 12/30/2022 10/13/22   Prosperi, Christian H, PA-C  levETIRAcetam (KEPPRA) 500 MG tablet Take 1 tablet (500 mg total) by mouth 2 (two) times daily. Patient not taking: Reported on 12/30/2022 11/16/22   Windell Norfolk, MD    Allergies:  No Known Allergies  Social History:  reports that he has been smoking cigarettes. He has been smoking an average of .5 packs per day. He has never used smokeless tobacco. He reports current alcohol use. He reports that he does not use drugs.  Family History: Family History  Problem Relation Age of Onset   Hypertension Mother    Hypertension Father    Kidney disease Father    Diabetes Maternal Grandmother     Physical Exam: Vitals:   12/30/22 1831 12/30/22 1957 12/30/22 2000 12/30/22 2002  BP:   (!) 150/71   Pulse:  (!) 101 100 92  Resp:  Temp: 98 F (36.7 C)     TempSrc: Oral     SpO2:  96% 96% 95%    General:  Alert and oriented times three, well developed and nourished, no acute distress Eyes: PERRLA, pink conjunctiva, no scleral icterus ENT: Moist oral mucosa, neck supple, no thyromegaly Lungs: clear to ascultation, no wheeze, no crackles, no use of accessory muscles  Cardiovascular: regular rate and rhythm, no regurgitation, no gallops, no murmurs. No carotid bruits, no JVD Abdomen: soft, positive BS, non-tender, non-distended, no organomegaly, not an acute abdomen GU: not examined Neuro: CN II - XII grossly intact, sensation intact Musculoskeletal: strength 5/5 all extremities, no clubbing, cyanosis or edema Skin: no rash, no subcutaneous crepitation, no decubitus Psych: appropriate patient   Labs on Admission:  Recent Labs     12/30/22 1502  NA 142  K 4.1  CL 109  CO2 16*  GLUCOSE 127*  BUN 10  CREATININE 1.22  CALCIUM 9.6   Recent Labs    12/30/22 1502  AST 31  ALT 38  ALKPHOS 68  BILITOT 0.5  PROT 7.2  ALBUMIN 4.2    Recent Labs    12/30/22 1502  WBC 15.1*  NEUTROABS 12.6*  HGB 16.2  HCT 49.6  MCV 94.1  PLT 232     Radiological Exams on Admission: MR BRAIN WO CONTRAST  Result Date: 12/30/2022 CLINICAL DATA:  New onset seizure EXAM: MRI HEAD WITHOUT CONTRAST TECHNIQUE: Multiplanar, multiecho pulse sequences of the brain and surrounding structures were obtained without intravenous contrast. COMPARISON:  None Available. FINDINGS: Postcontrast imaging could not be obtained, as the patient had a seizure in the scanner a few minutes after receiving contrast, at which point the study was terminated. Brain: The hippocampi are symmetric in size and signal. No heterotopia or evidence of cortical dysgenesis. No restricted diffusion to suggest acute or subacute infarct. No acute hemorrhage, mass, mass effect, or midline shift. No hydrocephalus or extra-axial collection. Normal pituitary and craniocervical junction. No hemosiderin deposition to suggest remote hemorrhage. Vascular: Normal arterial flow voids. Skull and upper cervical spine: Normal marrow signal. Sinuses/Orbits: Overall clear paranasal sinuses. No acute finding in the orbits. Dysconjugate gaze. Other: The mastoid air cells are well aerated. IMPRESSION: No acute intracranial process. No seizure etiology is identified. Electronically Signed   By: Wiliam Ke M.D.   On: 12/30/2022 19:47   CT Head Wo Contrast  Result Date: 12/30/2022 CLINICAL DATA:  Seizure EXAM: CT HEAD WITHOUT CONTRAST CT CERVICAL SPINE WITHOUT CONTRAST TECHNIQUE: Multidetector CT imaging of the head and cervical spine was performed following the standard protocol without intravenous contrast. Multiplanar CT image reconstructions of the cervical spine were also generated.  RADIATION DOSE REDUCTION: This exam was performed according to the departmental dose-optimization program which includes automated exposure control, adjustment of the mA and/or kV according to patient size and/or use of iterative reconstruction technique. COMPARISON:  10/13/2022 CT head, 06/06/2016 CT cervical spine FINDINGS: CT HEAD FINDINGS Evaluation is somewhat limited by motion artifact. Brain: No evidence of acute infarct, hemorrhage, mass, mass effect, or midline shift. No hydrocephalus or extra-axial fluid collection. Arachnoid cyst in the medial right posterior fossa. Vascular: No hyperdense vessel. Skull: Negative for fracture or focal lesion. Sinuses/Orbits: No acute finding. Other: The mastoid air cells are well aerated. CT CERVICAL SPINE FINDINGS Alignment: No traumatic listhesis. Straightening of the normal cervical lordosis. Skull base and vertebrae: No acute fracture. No primary bone lesion or focal pathologic process. Soft tissues and spinal canal: No prevertebral fluid or swelling. No visible canal hematoma. Disc levels: Degenerative changes in the cervical spine. No significant spinal canal stenosis. Upper chest: Negative. IMPRESSION: 1. Evaluation is somewhat limited by motion artifact. Within this limitation, no acute intracranial process. 2. No acute fracture or traumatic listhesis in the cervical spine. Electronically Signed   By: Wiliam Ke M.D.   On: 12/30/2022 16:37  CT Cervical Spine Wo Contrast  Result Date: 12/30/2022 CLINICAL DATA:  Seizure EXAM: CT HEAD WITHOUT CONTRAST CT CERVICAL SPINE WITHOUT CONTRAST TECHNIQUE: Multidetector CT imaging of the head and cervical spine was performed following the standard protocol without intravenous contrast. Multiplanar CT image reconstructions of the cervical spine were also generated. RADIATION DOSE REDUCTION: This exam was performed according to the departmental dose-optimization program which includes automated exposure control,  adjustment of the mA and/or kV according to patient size and/or use of iterative reconstruction technique. COMPARISON:  10/13/2022 CT head, 06/06/2016 CT cervical spine FINDINGS: CT HEAD FINDINGS Evaluation is somewhat limited by motion artifact. Brain: No evidence of acute infarct, hemorrhage, mass, mass effect, or midline shift. No hydrocephalus or extra-axial fluid collection. Arachnoid cyst in the medial right posterior fossa. Vascular: No hyperdense vessel. Skull: Negative for fracture or focal lesion. Sinuses/Orbits: No acute finding. Other: The mastoid air cells are well aerated. CT CERVICAL SPINE FINDINGS Alignment: No traumatic listhesis. Straightening of the normal cervical lordosis. Skull base and vertebrae: No acute fracture. No primary bone lesion or focal pathologic process. Soft tissues and spinal canal: No prevertebral fluid or swelling. No visible canal hematoma. Disc levels: Degenerative changes in the cervical spine. No significant spinal canal stenosis. Upper chest: Negative. IMPRESSION: 1. Evaluation is somewhat limited by motion artifact. Within this limitation, no acute intracranial process. 2. No acute fracture or traumatic listhesis in the cervical spine. Electronically Signed   By: Wiliam Ke M.D.   On: 12/30/2022 16:37    Assessment/Plan Present on Admission: Seizures -Seen by neurology.  Appreciate neuro's input. -Patient loaded with valproic acid. Oxcarbazepine increased to 600mg  BID by neuro -MRI head w/  & w/o contrast ordered. Partially completed. Pt w/ seizure in MRI -LTM EEG ordered -UA, UDS, CXR done -Ativan PRN seizures ordered -seizure precautions -wife at bedside updated on plans. All questions answered   Yohann Curl 12/30/2022, 8:40 PM

## 2022-12-30 NOTE — Telephone Encounter (Signed)
Spoke with the ED doctors, they will increase his Trileptal level to 600 mg BID

## 2022-12-31 ENCOUNTER — Other Ambulatory Visit (HOSPITAL_COMMUNITY): Payer: Self-pay

## 2022-12-31 DIAGNOSIS — R569 Unspecified convulsions: Secondary | ICD-10-CM | POA: Diagnosis not present

## 2022-12-31 LAB — URINALYSIS, ROUTINE W REFLEX MICROSCOPIC
Bacteria, UA: NONE SEEN
Bilirubin Urine: NEGATIVE
Glucose, UA: NEGATIVE mg/dL
Hgb urine dipstick: NEGATIVE
Ketones, ur: 5 mg/dL — AB
Leukocytes,Ua: NEGATIVE
Nitrite: NEGATIVE
Protein, ur: 30 mg/dL — AB
Specific Gravity, Urine: 1.023 (ref 1.005–1.030)
pH: 5 (ref 5.0–8.0)

## 2022-12-31 LAB — CBC
HCT: 44.9 % (ref 39.0–52.0)
Hemoglobin: 15.3 g/dL (ref 13.0–17.0)
MCH: 31 pg (ref 26.0–34.0)
MCHC: 34.1 g/dL (ref 30.0–36.0)
MCV: 90.9 fL (ref 80.0–100.0)
Platelets: 214 10*3/uL (ref 150–400)
RBC: 4.94 MIL/uL (ref 4.22–5.81)
RDW: 13.6 % (ref 11.5–15.5)
WBC: 15.1 10*3/uL — ABNORMAL HIGH (ref 4.0–10.5)
nRBC: 0 % (ref 0.0–0.2)

## 2022-12-31 LAB — BASIC METABOLIC PANEL
Anion gap: 11 (ref 5–15)
BUN: 8 mg/dL (ref 6–20)
CO2: 21 mmol/L — ABNORMAL LOW (ref 22–32)
Calcium: 9 mg/dL (ref 8.9–10.3)
Chloride: 107 mmol/L (ref 98–111)
Creatinine, Ser: 1.03 mg/dL (ref 0.61–1.24)
GFR, Estimated: >60 mL/min (ref >60)
Glucose, Bld: 88 mg/dL (ref 70–99)
Potassium: 3.5 mmol/L (ref 3.5–5.1)
Sodium: 139 mmol/L (ref 135–145)

## 2022-12-31 LAB — RAPID URINE DRUG SCREEN, HOSP PERFORMED
Amphetamines: NOT DETECTED
Barbiturates: NOT DETECTED
Benzodiazepines: NOT DETECTED
Cocaine: NOT DETECTED
Opiates: NOT DETECTED
Tetrahydrocannabinol: POSITIVE — AB

## 2022-12-31 MED ORDER — VALTOCO 20 MG DOSE 10 MG/0.1ML NA LQPK: 1.0000 | NASAL | 0 refills | Status: DC | PRN

## 2022-12-31 MED ORDER — VALTOCO 20 MG DOSE 10 MG/0.1ML NA LQPK
1.0000 | NASAL | 0 refills | Status: DC | PRN
  Filled 2022-12-31: qty 4, 15d supply, fill #0
  Filled 2022-12-31: qty 4, 30d supply, fill #0

## 2022-12-31 MED ORDER — OXCARBAZEPINE 600 MG PO TABS: 600.0000 mg | ORAL_TABLET | Freq: Two times a day (BID) | ORAL | 3 refills | Status: DC

## 2022-12-31 MED ORDER — OXCARBAZEPINE 300 MG PO TABS
600.0000 mg | ORAL_TABLET | Freq: Two times a day (BID) | ORAL | 3 refills | Status: DC
  Filled 2022-12-31: qty 120, 30d supply, fill #0

## 2022-12-31 NOTE — Progress Notes (Signed)
Subjective: No further seizures. Back to baseline.   ROS: negative except above  Examination  Vital signs in last 24 hours: Temp:  [98 F (36.7 C)-98.4 F (36.9 C)] 98.4 F (36.9 C) (04/19 1124) Pulse Rate:  [58-101] 85 (04/19 1124) Resp:  [15-27] 18 (04/19 1124) BP: (119-182)/(65-104) 142/75 (04/19 1124) SpO2:  [92 %-99 %] 99 % (04/19 1124) Weight:  [111.6 kg] 111.6 kg (04/18 2114)  General: lying in bed, NAD Neuro: MS: Alert, oriented, follows commands CN: pupils equal and reactive,  EOMI, face symmetric, tongue midline, normal sensation over face, Motor: 5/5 strength in all 4 extremities Coordination: normal Gait: not tested  Basic Metabolic Panel: Recent Labs  Lab 12/30/22 1502 12/31/22 0148  NA 142 139  K 4.1 3.5  CL 109 107  CO2 16* 21*  GLUCOSE 127* 88  BUN 10 8  CREATININE 1.22 1.03  CALCIUM 9.6 9.0    CBC: Recent Labs  Lab 12/30/22 1502 12/31/22 0148  WBC 15.1* 15.1*  NEUTROABS 12.6*  --   HGB 16.2 15.3  HCT 49.6 44.9  MCV 94.1 90.9  PLT 232 214     Coagulation Studies: No results for input(s): "LABPROT", "INR" in the last 72 hours.  Imaging MR Brain wo contrast 12/30/2022: No acute intracranial process. No seizure etiology is identified.   ASSESSMENT AND PLAN: 32yo M with h/o epilepsy presented with breakthrough seizure, unclear etiology.   Epilepsy with breakthrough seizure Cannabis use disorder - no clear etiology  Recommendations - Increased Oxcarb to  BID ( was on  BID) - rescue medication: Intranasal valtoco  for seizure lasting over 2 minutes - Discussed side effects including hyponatremia, dizziness - Discussed seizure precautions - Cannabis cessation counseling - f/u with Dr Teresa Coombs - Discussed plan with Dr Maryfrances Bunnell, via secure chat, family at bedside.  Seizure precautions: Per [facility] statutes, patients with seizures are not allowed to drive until they have been seizure-free for six months and cleared  by a physician    Use caution when using heavy equipment or power tools. Avoid working on ladders or at heights. Take showers instead of baths. Ensure the water temperature is not too high on the home water heater. Do not go swimming alone. Do not lock yourself in a room alone (i.e. bathroom). When caring for infants or small children, sit down when holding, feeding, or changing them to minimize risk of injury to the child in the event you have a seizure. Maintain good sleep hygiene. Avoid alcohol.    If patient has another seizure, call 911 and bring them back to the ED if: A.  The seizure lasts longer than 5 minutes.      B.  The patient doesn't wake shortly after the seizure or has new problems such as difficulty seeing, speaking or moving following the seizure C.  The patient was injured during the seizure D.  The patient has a temperature over 102 F (39C) E.  The patient vomited during the seizure and now is having trouble breathing    During the Seizure   - First, ensure adequate ventilation and place patients on the floor on their left side  Loosen clothing around the neck and ensure the airway is patent. If the patient is clenching the teeth, do not force the mouth open with any object as this can cause severe damage - Remove all items from the surrounding that can be hazardous. The patient may be oblivious to what's happening and may not even  know what he or she is doing. If the patient is confused and wandering, either gently guide him/her away and block access to outside areas - Reassure the individual and be comforting - Call 911. In most cases, the seizure ends before EMS arrives. However, there are cases when seizures may last over 3 to 5 minutes. Or the individual may have developed breathing difficulties or severe injuries. If a pregnant patient or a person with diabetes develops a seizure, it is prudent to call an ambulance. - Finally, if the patient does not regain full  consciousness, then call EMS. Most patients will remain confused for about 45 to 90 minutes after a seizure, so you must use judgment in calling for help.   After the Seizure (Postictal Stage)   After a seizure, most patients experience confusion, fatigue, muscle pain and/or a headache. Thus, one should permit the individual to sleep. For the next few days, reassurance is essential. Being calm and helping reorient the person is also of importance.   Most seizures are painless and end spontaneously. Seizures are not harmful to others but can lead to complications such as stress on the lungs, brain and the heart. Individuals with prior lung problems may develop labored breathing and respiratory distress.     I have spent a total of   45 minutes with the patient reviewing hospital notes,  test results, labs and examining the patient as well as establishing an assessment and plan that was discussed personally with the patient.  > 50% of time was spent in direct patient care.    Lindie Spruce Epilepsy [facility] For questions after 5pm please refer to AMION to reach the Neurologist on call

## 2022-12-31 NOTE — Procedures (Addendum)
Patient Name: Timothy Haynes  MRN: [id number]  Epilepsy Attending: Charlsie Quest  Referring Physician/Provider: Milon Dikes, MD  Duration: 12/30/2022 2133 to 12/31/2022 0859  Patient history: 32 year old man with relatively new onset epilepsy in February who did not tolerate Keppra well and was changed to oxcarbazepine presents today with 2 seizures at home and another in the emergency room while undergoing evaluation. EEG to evaluate for seizure.  Level of alertness: Awake, asleep  AEDs during EEG study: OXC  Technical aspects: This EEG study was done with scalp electrodes positioned according to the 10-20 International system of electrode placement. Electrical activity was reviewed with band pass filter of 1-70Hz , sensitivity of 7 uV/mm, display speed of 19mm/sec with a  notched filter applied as appropriate. EEG data were recorded continuously and digitally stored.  Video monitoring was available and reviewed as appropriate.  Description: The posterior dominant rhythm consists of 9-10 Hz activity of moderate voltage (25-35 uV) seen predominantly in posterior head regions, symmetric and reactive to eye opening and eye closing. Sleep was characterized by vertex waves, sleep spindles (12 to 14 Hz), maximal frontocentral region. Sharp waves were noted in left temporal region.   Hyperventilation and photic stimulation were not performed.     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  No seizures were seen throughout the recording.     [name]

## 2022-12-31 NOTE — Hospital Course (Signed)
Timothy Haynes is a 32 y.o. M with recent diagnosis seizures who presented with recurrent seizure.  Evidently recently changed from Keppra to Trileptal due to mood effects of Keppra.  On day of admission, called wife at work that he was having a seizure, when she arrived home he was confused and had bitten his lip, then had GTC seizure and EMS was activated.  In the ER, loaded with Depakote and Neurology consulted.

## 2022-12-31 NOTE — TOC Benefit Eligibility Note (Signed)
Patient Product/process development scientist completed.    The patient is currently admitted and upon discharge could be taking Valtoco 20 mg.  The current 30 day co-pay is $4.00.   The patient is insured through [facility] [place]   This test claim was processed through [facility]- copay amounts may vary at other pharmacies due to pharmacy/plan contracts, or as the patient moves through the different stages of their insurance plan.  Roland Earl, CPHT Pharmacy Patient Advocate Specialist [facility] Patient Advocate Team Direct Number: [phone]  Fax: [phone]

## 2022-12-31 NOTE — Discharge Summary (Signed)
Physician Discharge Summary   Patient: Timothy Haynes MRN: [id number] DOB: 1990/11/21  Admit date:     12/30/2022  Discharge date: 12/31/22  Discharge Physician: Alberteen Sam   PCP: Patient, No Pcp Per     Recommendations at discharge:  Follow up with Dr. Teresa Coombs in 2 months for recurrent seizure     Discharge Diagnoses: Principal Problem:   Seizure     Hospital Course: Timothy Haynes is a 32 y.o. M with recent diagnosis seizures who presented with recurrent seizure.  Evidently recently changed from Keppra to Trileptal due to mood effects of Keppra.  On day of admission, called wife at work that he was having a seizure, when she arrived home he was confused and had bitten his lip, then had GTC seizure and EMS was activated.  In the ER, loaded with Depakote and Neurology consulted.    Seizure MRI brain unchanged.  EEG repeated and showed same left temporal dysfunction.  Neurology consulted and increased Trileptal to 600 mg twice daily.  Patient observed overnight without recurrent events.  CBC and BMP normal.  No focal symptoms of infection, systemic illness.  UDS positive for THC and counseling given.  Discharged with Valtoco abortive.         The [facility] Controlled Substances Registry was reviewed for this patient prior to discharge.  Consultants: Neurology, Dr. Melynda Ripple Procedures performed:  MRI brain EEG   Disposition: Home Diet recommendation:  Regular  DISCHARGE MEDICATION: Allergies as of 12/31/2022   No Known Allergies      Medication List     TAKE these medications    oxcarbazepine 600 MG tablet Commonly known as: Trileptal Take 1 tablet (600 mg total) by mouth 2 (two) times daily. What changed:  medication strength how much to take   Valtoco 20 MG Dose 2 x 10 MG/0.1ML Lqpk Generic drug: diazePAM (20 MG Dose) Place 1 spray into the nose as needed (For seizure lasting over 2 mins).        Follow-up Information      Windell Norfolk, MD Follow up.   Specialty: Neurology Contact information: [address] [place] [address] [phone]                 Discharge Instructions     Discharge instructions   Complete by: As directed    **IMPORTANT DISCHARGE INSTRUCTIONS**   From Dr. Melynda Ripple and Timothy Haynes: You were admitted for a seizure Here, your MRI of the brain was reassuring (the picture showed nothing concerning) Your EEG (the electrical activity of the brain) showed the same left sided spot of abnormal activity  We recommend increasing your oxcarbazepine/Trileptal dose  Take Trileptal 600 mg twice daily  I have sent a prescription for larger tablets (600 mg tablets), so you are STILL TAKING 1 TABLET, using the same twice a day schedule  I also sent a prescription for an aborting medicine, diazepam nasal spray, also called Valtoco  This is to be used only to STOP a prolonged seizure.  If a seizure is lasting MORE THAN 2 MINUTES, give a spray of Valtoco in each nostril and call Dr. Teresa Coombs  If the seizure doesn't stop the Valtoco, call [phone]   Increase activity slowly   Complete by: As directed        Discharge Exam: Filed Weights   12/30/22 2114  Weight: 111.6 kg    General: Pt is alert, awake, not in acute distress Cardiovascular: RRR, nl S1-S2, no murmurs  appreciated.   No LE edema.   Respiratory: Normal respiratory rate and rhythm.  CTAB without rales or wheezes. Abdominal: Abdomen soft and non-tender.  No distension or HSM.   Neuro/Psych: Strength symmetric in upper and lower extremities.  Judgment and insight appear normal.   Condition at discharge: good  The results of significant diagnostics from this hospitalization (including imaging, microbiology, ancillary and laboratory) are listed below for reference.   Imaging Studies: Overnight EEG with video  Result Date: 12/31/2022 Timothy Quest, MD     12/31/2022  9:42 AM Patient Name: Timothy Haynes MRN:  [id number] Epilepsy Attending: [name] Referring Physician/Provider: Milon Dikes, MD Duration: 12/30/2022 2133 to 12/31/2022 0859 Patient history: 32 year old man with relatively new onset epilepsy in February who did not tolerate Keppra well and was changed to oxcarbazepine presents today with 2 seizures at home and another in the emergency room while undergoing evaluation. EEG to evaluate for seizure. Level of alertness: Awake, asleep AEDs during EEG study: [employer] Technical aspects: This EEG study was done with scalp electrodes positioned according to the 10-20 International system of electrode placement. Electrical activity was reviewed with band pass filter of 1-70Hz , sensitivity of 7 uV/mm, display speed of 62mm/sec with a  notched filter applied as appropriate. EEG data were recorded continuously and digitally stored.  Video monitoring was available and reviewed as appropriate. Description: The posterior dominant rhythm consists of 9-10 Hz activity of moderate voltage (25-35 uV) seen predominantly in posterior head regions, symmetric and reactive to eye opening and eye closing. Sleep was characterized by vertex waves, sleep spindles (12 to 14 Hz), maximal frontocentral region. Sharp waves were noted in left temporal region.  Hyperventilation and photic stimulation were not performed.   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  No seizures were seen throughout the recording. Timothy Haynes   MR BRAIN WO CONTRAST  Result Date: 12/30/2022 CLINICAL DATA:  New onset seizure EXAM: MRI HEAD WITHOUT CONTRAST TECHNIQUE: Multiplanar, multiecho pulse sequences of the brain and surrounding structures were obtained without intravenous contrast. COMPARISON:  None Available. FINDINGS: Postcontrast imaging could not be obtained, as the patient had a seizure in the scanner a few minutes after  receiving contrast, at which point the study was terminated. Brain: The hippocampi are symmetric in size and signal. No heterotopia or evidence of cortical dysgenesis. No restricted diffusion to suggest acute or subacute infarct. No acute hemorrhage, mass, mass effect, or midline shift. No hydrocephalus or extra-axial collection. Normal pituitary and craniocervical junction. No hemosiderin deposition to suggest remote hemorrhage. Vascular: Normal arterial flow voids. Skull and upper cervical spine: Normal marrow signal. Sinuses/Orbits: Overall clear paranasal sinuses. No acute finding in the orbits. Dysconjugate gaze. Other: The mastoid air cells are well aerated. IMPRESSION: No acute intracranial process. No seizure etiology is identified. Electronically Signed   By: Timothy Haynes M.D.   On: 12/30/2022 19:47   CT Head Wo Contrast  Result Date: 12/30/2022 CLINICAL DATA:  Seizure EXAM: CT HEAD WITHOUT CONTRAST CT CERVICAL SPINE WITHOUT CONTRAST TECHNIQUE: Multidetector CT imaging of the head and cervical spine was performed following the standard protocol without intravenous contrast. Multiplanar CT image reconstructions of the cervical spine were also generated. RADIATION DOSE REDUCTION: This exam was performed according to the departmental dose-optimization program which includes automated exposure control, adjustment of the mA and/or kV according to patient size and/or use of iterative reconstruction technique.  COMPARISON:  10/13/2022 CT head, 06/06/2016 CT cervical spine FINDINGS: CT HEAD FINDINGS Evaluation is somewhat limited by motion artifact. Brain: No evidence of acute infarct, hemorrhage, mass, mass effect, or midline shift. No hydrocephalus or extra-axial fluid collection. Arachnoid cyst in the medial right posterior fossa. Vascular: No hyperdense vessel. Skull: Negative for fracture or focal lesion. Sinuses/Orbits: No acute finding. Other: The mastoid air cells are well aerated. CT CERVICAL SPINE  FINDINGS Alignment: No traumatic listhesis. Straightening of the normal cervical lordosis. Skull base and vertebrae: No acute fracture. No primary bone lesion or focal pathologic process. Soft tissues and spinal canal: No prevertebral fluid or swelling. No visible canal hematoma. Disc levels: Degenerative changes in the cervical spine. No significant spinal canal stenosis. Upper chest: Negative. IMPRESSION: 1. Evaluation is somewhat limited by motion artifact. Within this limitation, no acute intracranial process. 2. No acute fracture or traumatic listhesis in the cervical spine. Electronically Signed   By: Timothy Haynes M.D.   On: 12/30/2022 16:37   CT Cervical Spine Wo Contrast  Result Date: 12/30/2022 CLINICAL DATA:  Seizure EXAM: CT HEAD WITHOUT CONTRAST CT CERVICAL SPINE WITHOUT CONTRAST TECHNIQUE: Multidetector CT imaging of the head and cervical spine was performed following the standard protocol without intravenous contrast. Multiplanar CT image reconstructions of the cervical spine were also generated. RADIATION DOSE REDUCTION: This exam was performed according to the departmental dose-optimization program which includes automated exposure control, adjustment of the mA and/or kV according to patient size and/or use of iterative reconstruction technique. COMPARISON:  10/13/2022 CT head, 06/06/2016 CT cervical spine FINDINGS: CT HEAD FINDINGS Evaluation is somewhat limited by motion artifact. Brain: No evidence of acute infarct, hemorrhage, mass, mass effect, or midline shift. No hydrocephalus or extra-axial fluid collection. Arachnoid cyst in the medial right posterior fossa. Vascular: No hyperdense vessel. Skull: Negative for fracture or focal lesion. Sinuses/Orbits: No acute finding. Other: The mastoid air cells are well aerated. CT CERVICAL SPINE FINDINGS Alignment: No traumatic listhesis. Straightening of the normal cervical lordosis. Skull base and vertebrae: No acute fracture. No primary bone  lesion or focal pathologic process. Soft tissues and spinal canal: No prevertebral fluid or swelling. No visible canal hematoma. Disc levels: Degenerative changes in the cervical spine. No significant spinal canal stenosis. Upper chest: Negative. IMPRESSION: 1. Evaluation is somewhat limited by motion artifact. Within this limitation, no acute intracranial process. 2. No acute fracture or traumatic listhesis in the cervical spine. Electronically Signed   By: Timothy Haynes M.D.   On: 12/30/2022 16:37    Microbiology: Results for orders placed or performed during the hospital encounter of 10/13/22  Urine Culture     Status: None   Collection Time: 10/13/22  9:41 AM   Specimen: Urine, Clean Catch  Result Value Ref Range Status   Specimen Description URINE, CLEAN CATCH  Final   Special Requests NONE  Final   Culture   Final    NO GROWTH Performed at [facility], [address], [place], [place] [address]    Report Status 10/14/2022 FINAL  Final    Labs: CBC: Recent Labs  Lab 12/30/22 1502 12/31/22 0148  WBC 15.1* 15.1*  NEUTROABS 12.6*  --   HGB 16.2 15.3  HCT 49.6 44.9  MCV 94.1 90.9  PLT 232 214   Basic Metabolic Panel: Recent Labs  Lab 12/30/22 1502 12/31/22 0148  NA 142 139  K 4.1 3.5  CL 109 107  CO2 16* 21*  GLUCOSE 127* 88  BUN 10  8  CREATININE 1.22 1.03  CALCIUM 9.6 9.0   Liver Function Tests: Recent Labs  Lab 12/30/22 1502  AST 31  ALT 38  ALKPHOS 68  BILITOT 0.5  PROT 7.2  ALBUMIN 4.2   CBG: Recent Labs  Lab 12/30/22 1501  GLUCAP 128*    Discharge time spent: approximately 35 minutes spent on discharge counseling, evaluation of patient on day of discharge, and coordination of discharge planning with nursing, social work, pharmacy and case management  Signed: Alberteen Sam, MD [facility] 12/31/2022

## 2022-12-31 NOTE — Progress Notes (Signed)
LTM EEG discontinued - no skin breakdown at unhook.   

## 2022-12-31 NOTE — ED Notes (Signed)
ED TO INPATIENT HANDOFF REPORT  ED Nurse Name and Phone #: [phone]  S Name/Age/Gender Timothy Haynes 32 y.o. male Room/Bed: 040C/040C  Code Status   Code Status: Full Code  Home/SNF/Other Home Patient oriented to: self, place, time, and situation Is this baseline? Yes   Triage Complete: Triage complete  Chief Complaint Seizures [R56.9]  Triage Note Hx of seizures. Recent change in seizure meds a week ago. 2 seizure episodes with one at noon witnessed by family. 2nd seizure episode while sitting on the couch. No fall/injuries. Pt is incontinent of urine. Post ictal currently.    Allergies No Known Allergies  Level of Care/Admitting Diagnosis ED Disposition     ED Disposition  Admit   Condition  --   Comment  Hospital Area: [facility] [[id number]]  Level of Care: Telemetry Cardiac [[id number]]  May admit patient to [facility] or [facility] if equivalent level of care is available:: No  Covid Evaluation: Confirmed COVID Negative  Diagnosis: Seizures [205091]  Admitting Physician: Gery Pray [[id number]]  Attending Physician: Gery Pray [[id number]]  Certification:: I certify this patient will need inpatient services for at least 2 midnights  Estimated Length of Stay: 2          B Medical/Surgery History Past Medical History:  Diagnosis Date   Seizures    Past Surgical History:  Procedure Laterality Date   FOOT SURGERY       A IV Location/Drains/Wounds Patient Lines/Drains/Airways Status     Active Line/Drains/Airways     Name Placement date Placement time Site Days   Peripheral IV 12/30/22 20 G Right Antecubital 12/30/22  1456  Antecubital  1            Intake/Output Last 24 hours  Intake/Output Summary (Last 24 hours) at 12/31/2022 0931 Last data filed at 12/30/2022 2226 Gross per 24 hour  Intake 66.24 ml  Output --  Net 66.24 ml    Labs/Imaging Results for orders placed or performed during the hospital encounter of 12/30/22  (from the past 48 hour(s))  CBG monitoring, ED     Status: Abnormal   Collection Time: 12/30/22  3:01 PM  Result Value Ref Range   Glucose-Capillary 128 (H) 70 - 99 mg/dL    Comment: Glucose reference range applies only to samples taken after fasting for at least 8 hours.  Comprehensive metabolic panel     Status: Abnormal   Collection Time: 12/30/22  3:02 PM  Result Value Ref Range   Sodium 142 135 - 145 mmol/L   Potassium 4.1 3.5 - 5.1 mmol/L   Chloride 109 98 - 111 mmol/L   CO2 16 (L) 22 - 32 mmol/L   Glucose, Bld 127 (H) 70 - 99 mg/dL    Comment: Glucose reference range applies only to samples taken after fasting for at least 8 hours.   BUN 10 6 - 20 mg/dL   Creatinine, Ser 2.44 0.61 - 1.24 mg/dL   Calcium 9.6 8.9 - 01.0 mg/dL   Total Protein 7.2 6.5 - 8.1 g/dL   Albumin 4.2 3.5 - 5.0 g/dL   AST 31 15 - 41 U/L   ALT 38 0 - 44 U/L   Alkaline Phosphatase 68 38 - 126 U/L   Total Bilirubin 0.5 0.3 - 1.2 mg/dL   GFR, Estimated >27 >25 mL/min    Comment: (NOTE) Calculated using the CKD-EPI Creatinine Equation (2021)    Anion gap 17 (H) 5 - 15    Comment: Performed  at [facility], [address], [place], [place] [address]  CBC with Differential/Platelet     Status: Abnormal   Collection Time: 12/30/22  3:02 PM  Result Value Ref Range   WBC 15.1 (H) 4.0 - 10.5 K/uL   RBC 5.27 4.22 - 5.81 MIL/uL   Hemoglobin 16.2 13.0 - 17.0 g/dL   HCT 52.8 41.3 - 24.4 %   MCV 94.1 80.0 - 100.0 fL   MCH 30.7 26.0 - 34.0 pg   MCHC 32.7 30.0 - 36.0 g/dL   RDW 01.0 27.2 - 53.6 %   Platelets 232 150 - 400 K/uL   nRBC 0.0 0.0 - 0.2 %   Neutrophils Relative % 83 %   Neutro Abs 12.6 (H) 1.7 - 7.7 K/uL   Lymphocytes Relative 12 %   Lymphs Abs 1.8 0.7 - 4.0 K/uL   Monocytes Relative 5 %   Monocytes Absolute 0.7 0.1 - 1.0 K/uL   Eosinophils Relative 0 %   Eosinophils Absolute 0.0 0.0 - 0.5 K/uL   Basophils Relative 0 %   Basophils Absolute 0.0 0.0 - 0.1 K/uL   Immature Granulocytes 0 %    Abs Immature Granulocytes 0.06 0.00 - 0.07 K/uL    Comment: Performed at [facility], [address] [place], [place] [address]  Basic metabolic panel     Status: Abnormal   Collection Time: 12/31/22  1:48 AM  Result Value Ref Range   Sodium 139 135 - 145 mmol/L   Potassium 3.5 3.5 - 5.1 mmol/L   Chloride 107 98 - 111 mmol/L   CO2 21 (L) 22 - 32 mmol/L   Glucose, Bld 88 70 - 99 mg/dL    Comment: Glucose reference range applies only to samples taken after fasting for at least 8 hours.   BUN 8 6 - 20 mg/dL   Creatinine, Ser 4.74 0.61 - 1.24 mg/dL   Calcium 9.0 8.9 - 25.9 mg/dL   GFR, Estimated >56 >38 mL/min    Comment: (NOTE) Calculated using the CKD-EPI Creatinine Equation (2021)    Anion gap 11 5 - 15    Comment: Performed at [facility], [address] [place], [place] [address]  CBC     Status: Abnormal   Collection Time: 12/31/22  1:48 AM  Result Value Ref Range   WBC 15.1 (H) 4.0 - 10.5 K/uL   RBC 4.94 4.22 - 5.81 MIL/uL   Hemoglobin 15.3 13.0 - 17.0 g/dL   HCT 32.9 51.8 - 84.1 %   MCV 90.9 80.0 - 100.0 fL   MCH 31.0 26.0 - 34.0 pg   MCHC 34.1 30.0 - 36.0 g/dL   RDW 66.0 63.0 - 16.0 %   Platelets 214 150 - 400 K/uL   nRBC 0.0 0.0 - 0.2 %    Comment: Performed at [facility], [address], [place], [place] [address]  Rapid urine drug screen (hospital performed)     Status: Abnormal   Collection Time: 12/31/22  6:00 AM  Result Value Ref Range   Opiates NONE DETECTED NONE DETECTED   Cocaine NONE DETECTED NONE DETECTED   Benzodiazepines NONE DETECTED NONE DETECTED   Amphetamines NONE DETECTED NONE DETECTED   Tetrahydrocannabinol POSITIVE (A) NONE DETECTED   Barbiturates NONE DETECTED NONE DETECTED    Comment: (NOTE) DRUG SCREEN FOR MEDICAL PURPOSES ONLY.  IF CONFIRMATION IS NEEDED FOR ANY PURPOSE, NOTIFY LAB WITHIN 5 DAYS.  LOWEST DETECTABLE LIMITS FOR URINE DRUG SCREEN  Drug Class                     Cutoff (ng/mL) Amphetamine and  metabolites    1000 Barbiturate and metabolites    200 Benzodiazepine                 200 Opiates and metabolites        300 Cocaine and metabolites        300 THC                            50 Performed at [facility], [address], [place], [place] [address]   Urinalysis, Routine w reflex microscopic -Urine, Clean Catch     Status: Abnormal   Collection Time: 12/31/22  6:00 AM  Result Value Ref Range   Color, Urine YELLOW YELLOW   APPearance HAZY (A) CLEAR   Specific Gravity, Urine 1.023 1.005 - 1.030   pH 5.0 5.0 - 8.0   Glucose, UA NEGATIVE NEGATIVE mg/dL   Hgb urine dipstick NEGATIVE NEGATIVE   Bilirubin Urine NEGATIVE NEGATIVE   Ketones, ur 5 (A) NEGATIVE mg/dL   Protein, ur 30 (A) NEGATIVE mg/dL   Nitrite NEGATIVE NEGATIVE   Leukocytes,Ua NEGATIVE NEGATIVE   RBC / HPF 0-5 0 - 5 RBC/hpf   WBC, UA 6-10 0 - 5 WBC/hpf   Bacteria, UA NONE SEEN NONE SEEN   Squamous Epithelial / HPF 0-5 0 - 5 /HPF   Mucus PRESENT     Comment: Performed at [facility], [address], [place], [place] [address]   Overnight EEG with video  Result Date: 12/31/2022 Charlsie Quest, MD     12/31/2022  8:53 AM Patient Name: ARSAL TAPPAN MRN: [id number] Epilepsy Attending: Charlsie Quest Referring Physician/Provider: Milon Dikes, MD Duration: 12/30/2022 2133 to 12/31/2022 0845 Patient history: 32 year old man with relatively new onset epilepsy in February who did not tolerate Keppra well and was changed to oxcarbazepine presents today with 2 seizures at home and another in the emergency room while undergoing evaluation. EEG to evaluate for seizure. Level of alertness: Awake, asleep AEDs during EEG study: [employer] Technical aspects: This EEG study was done with scalp electrodes positioned according to the 10-20 International system of electrode placement. Electrical activity was reviewed with band pass filter of 1-70Hz , sensitivity of 7 uV/mm, display speed of 73mm/sec with a 60Hz  notched  filter applied as appropriate. EEG data were recorded continuously and digitally stored.  Video monitoring was available and reviewed as appropriate. Description: The posterior dominant rhythm consists of 9-10 Hz activity of moderate voltage (25-35 uV) seen predominantly in posterior head regions, symmetric and reactive to eye opening and eye closing. Sleep was characterized by vertex waves, sleep spindles (12 to 14 Hz), maximal frontocentral region. Sharp waves were noted in left temporal region.  Hyperventilation and photic stimulation were not performed.   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  No seizures were seen throughout the recording. Charlsie Quest   MR BRAIN WO CONTRAST  Result Date: 12/30/2022 CLINICAL DATA:  New onset seizure EXAM: MRI HEAD WITHOUT CONTRAST TECHNIQUE: Multiplanar, multiecho pulse sequences of the brain and surrounding structures were obtained without intravenous contrast. COMPARISON:  None Available. FINDINGS: Postcontrast imaging could not be obtained, as the patient had a seizure in the scanner a  few minutes after receiving contrast, at which point the study was terminated. Brain: The hippocampi are symmetric in size and signal. No heterotopia or evidence of cortical dysgenesis. No restricted diffusion to suggest acute or subacute infarct. No acute hemorrhage, mass, mass effect, or midline shift. No hydrocephalus or extra-axial collection. Normal pituitary and craniocervical junction. No hemosiderin deposition to suggest remote hemorrhage. Vascular: Normal arterial flow voids. Skull and upper cervical spine: Normal marrow signal. Sinuses/Orbits: Overall clear paranasal sinuses. No acute finding in the orbits. Dysconjugate gaze. Other: The mastoid air cells are well aerated. IMPRESSION: No acute intracranial process. No seizure etiology is identified.  Electronically Signed   By: Wiliam Ke M.D.   On: 12/30/2022 19:47   CT Head Wo Contrast  Result Date: 12/30/2022 CLINICAL DATA:  Seizure EXAM: CT HEAD WITHOUT CONTRAST CT CERVICAL SPINE WITHOUT CONTRAST TECHNIQUE: Multidetector CT imaging of the head and cervical spine was performed following the standard protocol without intravenous contrast. Multiplanar CT image reconstructions of the cervical spine were also generated. RADIATION DOSE REDUCTION: This exam was performed according to the departmental dose-optimization program which includes automated exposure control, adjustment of the mA and/or kV according to patient size and/or use of iterative reconstruction technique. COMPARISON:  10/13/2022 CT head, 06/06/2016 CT cervical spine FINDINGS: CT HEAD FINDINGS Evaluation is somewhat limited by motion artifact. Brain: No evidence of acute infarct, hemorrhage, mass, mass effect, or midline shift. No hydrocephalus or extra-axial fluid collection. Arachnoid cyst in the medial right posterior fossa. Vascular: No hyperdense vessel. Skull: Negative for fracture or focal lesion. Sinuses/Orbits: No acute finding. Other: The mastoid air cells are well aerated. CT CERVICAL SPINE FINDINGS Alignment: No traumatic listhesis. Straightening of the normal cervical lordosis. Skull base and vertebrae: No acute fracture. No primary bone lesion or focal pathologic process. Soft tissues and spinal canal: No prevertebral fluid or swelling. No visible canal hematoma. Disc levels: Degenerative changes in the cervical spine. No significant spinal canal stenosis. Upper chest: Negative. IMPRESSION: 1. Evaluation is somewhat limited by motion artifact. Within this limitation, no acute intracranial process. 2. No acute fracture or traumatic listhesis in the cervical spine. Electronically Signed   By: Wiliam Ke M.D.   On: 12/30/2022 16:37   CT Cervical Spine Wo Contrast  Result Date: 12/30/2022 CLINICAL DATA:  Seizure EXAM: CT  HEAD WITHOUT CONTRAST CT CERVICAL SPINE WITHOUT CONTRAST TECHNIQUE: Multidetector CT imaging of the head and cervical spine was performed following the standard protocol without intravenous contrast. Multiplanar CT image reconstructions of the cervical spine were also generated. RADIATION DOSE REDUCTION: This exam was performed according to the departmental dose-optimization program which includes automated exposure control, adjustment of the mA and/or kV according to patient size and/or use of iterative reconstruction technique. COMPARISON:  10/13/2022 CT head, 06/06/2016 CT cervical spine FINDINGS: CT HEAD FINDINGS Evaluation is somewhat limited by motion artifact. Brain: No evidence of acute infarct, hemorrhage, mass, mass effect, or midline shift. No hydrocephalus or extra-axial fluid collection. Arachnoid cyst in the medial right posterior fossa. Vascular: No hyperdense vessel. Skull: Negative for fracture or focal lesion. Sinuses/Orbits: No acute finding. Other: The mastoid air cells are well aerated. CT CERVICAL SPINE FINDINGS Alignment: No traumatic listhesis. Straightening of the normal cervical lordosis. Skull base and vertebrae: No acute fracture. No primary bone lesion or focal pathologic process. Soft tissues and spinal canal: No prevertebral fluid or swelling. No visible canal hematoma. Disc levels: Degenerative changes in the cervical spine. No significant spinal canal stenosis. Upper chest: Negative. IMPRESSION:  1. Evaluation is somewhat limited by motion artifact. Within this limitation, no acute intracranial process. 2. No acute fracture or traumatic listhesis in the cervical spine. Electronically Signed   By: Wiliam Ke M.D.   On: 12/30/2022 16:37    Pending Labs Unresulted Labs (From admission, onward)     Start     Ordered   01/06/23 0500  Creatinine, serum  (enoxaparin (LOVENOX)    CrCl >/= 30 ml/min)  Weekly,   R     Comments: while on enoxaparin therapy    12/30/22 2252    12/30/22 1542  10-Hydroxycarbazepine  Once,   URGENT        12/30/22 1541            Vitals/Pain Today's Vitals   12/31/22 0330 12/31/22 0435 12/31/22 0630 12/31/22 0800  BP: 124/84  (!) 142/82 (!) 152/91  Pulse: 68  64 81  Resp: 18  (!) 27 20  Temp: 98 F (36.7 C)     TempSrc: Oral     SpO2: 95%  96% 97%  Weight:      Height:      PainSc: Asleep 0-No pain      Isolation Precautions No active isolations  Medications Medications  Oxcarbazepine (TRILEPTAL) tablet 600 mg (600 mg Oral Given 12/31/22 0407)  LORazepam (ATIVAN) injection 2 mg (has no administration in time range)  enoxaparin (LOVENOX) injection 50 mg (has no administration in time range)  acetaminophen (TYLENOL) tablet 650 mg (has no administration in time range)    Or  acetaminophen (TYLENOL) suppository 650 mg (has no administration in time range)  acetaminophen (TYLENOL) tablet 650 mg (650 mg Oral Given 12/30/22 1637)  Oxcarbazepine (TRILEPTAL) tablet 600 mg (600 mg Oral Given 12/30/22 1709)  gadobutrol (GADAVIST) 1 MMOL/ML injection 10 mL (10 mLs Intravenous Contrast Given 12/30/22 1901)  valproate (DEPACON) 2,000 mg in dextrose 5 % 50 mL IVPB (0 mg Intravenous Stopped 12/30/22 2226)    Mobility walks     Focused Assessments Cardiac Rhythm: Normal sinus rhythm       Neuro Assessment: Within Defined Limits    R Recommendations: See Admitting Provider Note  Report given to:   Additional Notes:

## 2023-01-05 LAB — 10-HYDROXYCARBAZEPINE: Triliptal/MTB(Oxcarbazepin): 7 ug/mL — ABNORMAL LOW (ref 10–35)

## 2023-03-02 ENCOUNTER — Encounter: Payer: Self-pay | Admitting: Neurology

## 2023-03-02 ENCOUNTER — Ambulatory Visit (INDEPENDENT_AMBULATORY_CARE_PROVIDER_SITE_OTHER): Payer: Medicaid Other | Admitting: Neurology

## 2023-03-02 VITALS — BP 138/88 | HR 88 | Ht 66.0 in | Wt 273.0 lb

## 2023-03-02 DIAGNOSIS — Z5181 Encounter for therapeutic drug level monitoring: Secondary | ICD-10-CM

## 2023-03-02 DIAGNOSIS — G40909 Epilepsy, unspecified, not intractable, without status epilepticus: Secondary | ICD-10-CM | POA: Diagnosis not present

## 2023-03-02 NOTE — Progress Notes (Signed)
[facility]  PATIENT: Timothy Haynes DOB: [birth year]  REQUESTING CLINICIAN: No ref. provider found HISTORY FROM: Patient and family  REASON FOR VISIT: New onset seizures    HISTORICAL  CHIEF COMPLAINT:  Chief Complaint  Patient presents with   Follow-up    Rm 12, with wife Elease Hashimoto,  sz f/u. Last seizure 4/18 and ED visit ( 3 in one day), no missed medication doses, denies SI/HI   INTERVAL HISTORY 03/02/2023:  Patient presents today for follow-up, he is accompanied by wife.  Last visit was in March, at that time we have switched his Keppra to oxcarbazepine due to side effect from Keppra.  Unfortunately he did have a seizure on April 18, a total of 3 seizures that day.  His oxcarbazepine dose was increased from 300 to 600 mg twice daily.  Since then he has been doing well, denies any side effect from the medication.  Wife reports that his mood is much better since being off Keppra.  He would like to go back to work.  No other complaints no other concerns.    HISTORY OF PRESENT ILLNESS:  This is a 32 year old gentleman with no reported past medical history who is presenting for new onset epilepsy.  Patient reports going to bed the night of January 31 and waking up on the floor with tongue biting.  He was confused, he presented to the ED and there was a suspicion for seizures.  He was recommended to start Keppra 500 mg twice daily and discharged home.  At that time he reports that he did not take the Keppra.  On February 23 he had another event witnessed by wife.  Again it was during sleep and wife woke up in bed shaking.  He was also making a grunting noise.  He did have tongue biting and the EMS was called.  Seizure was described as generalized convulsion. Wife reports patient was confused initially.  In the ED workup unrevealing, he was recommended Keppra 500 mg twice daily.  Patient reports since discharge from the hospital he has been compliant with the Keppra 500 mg  twice daily, denies any seizure or seizure activity.  Denies any side effect from the medicine.  He denies any seizure risk factors, denies any family history of seizures.   Handedness Right handed    Onset: January 31  Seizure Type: generalized convulsion   Current frequency: 2 seizure reported   Any injuries from seizures: Tongue biting   Seizure risk factors: None   Previous ASMs: levetiracetam, Oxcarbazepine    Currenty ASMs: Oxcarbazepine 600 mg twice daily   ASMs side effects: Mood swing, and irritability with Keppra.  Brain Images: Normal head CT   Previous EEGs: Not performed    OTHER MEDICAL CONDITIONS: None reported   REVIEW OF SYSTEMS: Full 14 system review of systems performed and negative with exception of: As noted in the HPI   ALLERGIES: No Known Allergies  HOME MEDICATIONS: Outpatient Medications Prior to Visit  Medication Sig Dispense Refill   diazePAM, 20 MG Dose, (VALTOCO 20 MG DOSE) 2 x 10 MG/0.1ML LQPK Place 1 spray into the nose as needed (For seizure lasting over 2 mins). 6 each 0   Oxcarbazepine (TRILEPTAL) 300 MG tablet Take 2 tablets (600 mg total) by mouth 2 (two) times daily. 120 tablet 3   No facility-administered medications prior to visit.    PAST MEDICAL HISTORY: Past Medical History:  Diagnosis Date   Seizures (HCC)  PAST SURGICAL HISTORY: Past Surgical History:  Procedure Laterality Date   FOOT SURGERY      FAMILY HISTORY: Family History  Problem Relation Age of Onset   Hypertension Mother    Hypertension Father    Kidney disease Father    Diabetes Maternal Grandmother     SOCIAL HISTORY: Social History   Socioeconomic History   Marital status: Single    Spouse name: Not on file   Number of children: Not on file   Years of education: Not on file   Highest education level: Not on file  Occupational History   Not on file  Tobacco Use   Smoking status: Every Day    Packs/day: .5    Types: Cigarettes    Smokeless tobacco: Never  Substance and Sexual Activity   Alcohol use: Yes    Comment: occ   Drug use: No   Sexual activity: Yes    Comment: married and monagomous  Other Topics Concern   Not on file  Social History Narrative   Right handed   No caffeine   Lives at home with wife      Social Determinants of Health   Financial Resource Strain: Not on file  Food Insecurity: No Food Insecurity (12/30/2022)   Hunger Vital Sign    Worried About Running Out of Food in the Last Year: Never true    Ran Out of Food in the Last Year: Never true  Transportation Needs: No Transportation Needs (12/30/2022)   PRAPARE - Administrator, Civil Service (Medical): No    Lack of Transportation (Non-Medical): No  Physical Activity: Not on file  Stress: Not on file  Social Connections: Not on file  Intimate Partner Violence: Not At Risk (12/30/2022)   Humiliation, Afraid, Rape, and Kick questionnaire    Fear of Current or Ex-Partner: No    Emotionally Abused: No    Physically Abused: No    Sexually Abused: No     PHYSICAL EXAM   GENERAL EXAM/CONSTITUTIONAL: Vitals:  Vitals:   03/02/23 1427  BP: 138/88  Pulse: 88  SpO2: 93%  Weight: 273 lb (123.8 kg)  Height: 5\' 6"  (1.676 m)   Body mass index is 44.06 kg/m. Wt Readings from Last 3 Encounters:  03/02/23 273 lb (123.8 kg)  12/30/22 246 lb (111.6 kg)  11/16/22 265 lb (120.2 kg)   Patient is in no distress; well developed, nourished and groomed; neck is supple  MUSCULOSKELETAL: Gait, strength, tone, movements noted in Neurologic exam below  NEUROLOGIC: MENTAL STATUS:      No data to display         awake, alert, oriented to person, place and time recent and remote memory intact normal attention and concentration language fluent, comprehension intact, naming intact fund of knowledge appropriate  CRANIAL NERVE:  2nd, 3rd, 4th, 6th - Visual fields full to confrontation, extraocular muscles intact, no  nystagmus 5th - facial sensation symmetric 7th - facial strength symmetric 8th - hearing intact 9th - palate elevates symmetrically, uvula midline 11th - shoulder shrug symmetric 12th - tongue protrusion midline  MOTOR:  normal bulk and tone, full strength in the BUE, BLE  SENSORY:  normal and symmetric to light touch  COORDINATION:  finger-nose-finger, fine finger movements normal  REFLEXES:  deep tendon reflexes present and symmetric  GAIT/STATION:  normal    DIAGNOSTIC DATA (LABS, IMAGING, TESTING) - I reviewed patient records, labs, notes, testing and imaging myself where available.  Lab Results  Component Value Date   WBC 15.1 (H) 12/31/2022   HGB 15.3 12/31/2022   HCT 44.9 12/31/2022   MCV 90.9 12/31/2022   PLT 214 12/31/2022      Component Value Date/Time   NA 139 12/31/2022 0148   K 3.5 12/31/2022 0148   CL 107 12/31/2022 0148   CO2 21 (L) 12/31/2022 0148   GLUCOSE 88 12/31/2022 0148   BUN 8 12/31/2022 0148   CREATININE 1.03 12/31/2022 0148   CREATININE 0.86 09/29/2012 1201   CALCIUM 9.0 12/31/2022 0148   PROT 7.2 12/30/2022 1502   ALBUMIN 4.2 12/30/2022 1502   AST 31 12/30/2022 1502   ALT 38 12/30/2022 1502   ALKPHOS 68 12/30/2022 1502   BILITOT 0.5 12/30/2022 1502   GFRNONAA >60 12/31/2022 0148   GFRAA >60 06/06/2016 1815   Lab Results  Component Value Date   CHOL 124 02/06/2010   HDL 34 (L) 02/06/2010   LDLCALC 81 02/06/2010   TRIG 45 02/06/2010   No results found for: "HGBA1C" No results found for: "VITAMINB12" Lab Results  Component Value Date   TSH 3.444 02/06/2010    Head CT 10/13/2022 Unremarkable head CT with no acute intracranial pathology.   MRI Brain 12/30/2022:  No acute intracranial process. No seizure etiology is identified.   EEG 11/24/2022 Frequent left temporal sharp and slow wave discharges   I personally reviewed brain Images   ASSESSMENT AND PLAN  32 y.o. year old male  with no reported past medical history  who is presenting for follow up for his focal epilepsy.  He is doing well on oxcarbazepine 600 mg twice daily.  I will check a level and with a BMP.  If oxcarbazepine level is low or low normal, I will likely increase it to 900 mg twice daily.  Discussed driving restriction for total of 6 months.  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  I will see him in 6 months for follow-up or sooner if worse.    1. Nonintractable epilepsy without status epilepticus, unspecified epilepsy type (HCC)   2. Therapeutic drug monitoring      Patient Instructions  Continue with Oxcarbazepine 600 mg twice daily  Will check level today, if it is low or borderline normal, we will increase to 900 mg twice daily  Follow up in 6 months or sooner if worse    Per [facility] statutes, patients with seizures are not allowed to drive until they have been seizure-free for six months.  Other recommendations include using caution when using heavy equipment or power tools. Avoid working on ladders or at heights. Take showers instead of baths.  Do not swim alone.  Ensure the water temperature is not too high on the home water heater. Do not go swimming alone. Do not lock yourself in a room alone (i.e. bathroom). When caring for infants or small children, sit down when holding, feeding, or changing them to minimize risk of injury to the child in the event you have a seizure. Maintain good sleep hygiene. Avoid alcohol.  Also recommend adequate sleep, hydration, good diet and minimize stress.   Discussed Patients with epilepsy have a small risk of sudden unexpected death, a condition referred to as sudden unexpected death in epilepsy (SUDEP). SUDEP is defined specifically as the sudden, unexpected, witnessed or unwitnessed, nontraumatic and nondrowning death in patients with epilepsy with or without evidence  for a seizure, and  excluding documented status epilepticus, in which post mortem examination does not reveal a structural or toxicologic cause for death    During the Seizure  - First, ensure adequate ventilation and place patients on the floor on their left side  Loosen clothing around the neck and ensure the airway is patent. If the patient is clenching the teeth, do not force the mouth open with any object as this can cause severe damage - Remove all items from the surrounding that can be hazardous. The patient may be oblivious to what's happening and may not even know what he or she is doing. If the patient is confused and wandering, either gently guide him/her away and block access to outside areas - Reassure the individual and be comforting - Call 911. In most cases, the seizure ends before EMS arrives. However, there are cases when seizures may last over 3 to 5 minutes. Or the individual may have developed breathing difficulties or severe injuries. If a pregnant patient or a person with diabetes develops a seizure, it is prudent to call an ambulance. - Finally, if the patient does not regain full consciousness, then call EMS. Most patients will remain confused for about 45 to 90 minutes after a seizure, so you must use judgment in calling for help. - Avoid restraints but make sure the patient is in a bed with padded side rails - Place the individual in a lateral position with the neck slightly flexed; this will help the saliva drain from the mouth and prevent the tongue from falling backward - Remove all nearby furniture and other hazards from the area - Provide verbal assurance as the individual is regaining consciousness - Provide the patient with privacy if possible - Call for help and start treatment as ordered by the caregiver   After the Seizure (Postictal Stage)  After a seizure, most patients experience confusion, fatigue, muscle pain and/or a headache. Thus, one should permit the individual to  sleep. For the next few days, reassurance is essential. Being calm and helping reorient the person is also of importance.  Most seizures are painless and end spontaneously. Seizures are not harmful to others but can lead to complications such as stress on the lungs, brain and the heart. Individuals with prior lung problems may develop labored breathing and respiratory distress.     Orders Placed This Encounter  Procedures   10-Hydroxycarbazepine   Basic Metabolic Panel    No orders of the defined types were placed in this encounter.   Return in about 6 months (around 09/01/2023).    Windell Norfolk, MD 03/02/2023, 3:34 PM  [facility] [address], [address] [place], [place] [address] [phone]

## 2023-03-02 NOTE — Patient Instructions (Signed)
Continue with Oxcarbazepine 600 mg twice daily  Will check level today, if it is low or borderline normal, we will increase to 900 mg twice daily  Follow up in 6 months or sooner if worse

## 2023-03-03 LAB — 10-HYDROXYCARBAZEPINE

## 2023-03-03 LAB — BASIC METABOLIC PANEL
Glucose: 108 mg/dL — ABNORMAL HIGH (ref 70–99)
Sodium: 144 mmol/L (ref 134–144)

## 2023-03-05 LAB — BASIC METABOLIC PANEL
BUN/Creatinine Ratio: 10 (ref 9–20)
BUN: 11 mg/dL (ref 6–20)
CO2: 24 mmol/L (ref 20–29)
Calcium: 10.1 mg/dL (ref 8.7–10.2)
Chloride: 106 mmol/L (ref 96–106)
Creatinine, Ser: 1.07 mg/dL (ref 0.76–1.27)
Potassium: 3.9 mmol/L (ref 3.5–5.2)
eGFR: 95 mL/min/{1.73_m2} (ref >59)

## 2023-04-07 ENCOUNTER — Encounter (HOSPITAL_COMMUNITY): Payer: Self-pay

## 2023-04-07 ENCOUNTER — Emergency Department (HOSPITAL_COMMUNITY): Payer: Medicaid Other

## 2023-04-07 ENCOUNTER — Emergency Department (HOSPITAL_COMMUNITY)
Admission: EM | Admit: 2023-04-07 | Discharge: 2023-04-07 | Disposition: A | Discharge status: Home / Self Care | Payer: Medicaid Other | Attending: Emergency Medicine | Admitting: Emergency Medicine

## 2023-04-07 DIAGNOSIS — R569 Unspecified convulsions: Secondary | ICD-10-CM | POA: Diagnosis present

## 2023-04-07 DIAGNOSIS — R519 Headache, unspecified: Secondary | ICD-10-CM | POA: Insufficient documentation

## 2023-04-07 DIAGNOSIS — G40909 Epilepsy, unspecified, not intractable, without status epilepticus: Secondary | ICD-10-CM | POA: Diagnosis not present

## 2023-04-07 LAB — BASIC METABOLIC PANEL
Anion gap: 17 — ABNORMAL HIGH (ref 5–15)
BUN: 14 mg/dL (ref 6–20)
CO2: 19 mmol/L — ABNORMAL LOW (ref 22–32)
Calcium: 9.8 mg/dL (ref 8.9–10.3)
Chloride: 106 mmol/L (ref 98–111)
Creatinine, Ser: 0.94 mg/dL (ref 0.61–1.24)
GFR, Estimated: >60 mL/min (ref >60)
Glucose, Bld: 89 mg/dL (ref 70–99)
Potassium: 4.2 mmol/L (ref 3.5–5.1)
Sodium: 142 mmol/L (ref 135–145)

## 2023-04-07 LAB — CBC
HCT: 47.6 % (ref 39.0–52.0)
Hemoglobin: 16.4 g/dL (ref 13.0–17.0)
MCH: 31.6 pg (ref 26.0–34.0)
MCHC: 34.5 g/dL (ref 30.0–36.0)
MCV: 91.7 fL (ref 80.0–100.0)
Platelets: 209 10*3/uL (ref 150–400)
RBC: 5.19 MIL/uL (ref 4.22–5.81)
RDW: 13.5 % (ref 11.5–15.5)
WBC: 9.4 10*3/uL (ref 4.0–10.5)
nRBC: 0 % (ref 0.0–0.2)

## 2023-04-07 MED ORDER — MORPHINE SULFATE (PF) 4 MG/ML IV SOLN
4.0000 mg | Freq: Once | INTRAVENOUS | Status: AC
  Administered 2023-04-07: 4 mg via INTRAVENOUS
  Filled 2023-04-07: qty 1

## 2023-04-07 MED ORDER — ONDANSETRON HCL 4 MG/2ML IJ SOLN
4.0000 mg | Freq: Once | INTRAMUSCULAR | Status: AC
  Administered 2023-04-07: 4 mg via INTRAVENOUS
  Filled 2023-04-07: qty 2

## 2023-04-07 NOTE — ED Notes (Signed)
Patient transported to CT 

## 2023-04-07 NOTE — ED Triage Notes (Addendum)
Per EMS, Pt, from home, c/o severe headache following a tonic-clonic seizure.  Pain score 10/10.  Hx of seizures.  Pt reports he has not missed any doses of medication (Trileptal).  Last seizure was 2 months ago.  Pt was given rescue Diazepam prior to EMS arrival.

## 2023-04-07 NOTE — ED Notes (Signed)
ED Provider at bedside. 

## 2023-04-07 NOTE — ED Provider Notes (Signed)
[place] EMERGENCY DEPARTMENT AT [facility] Provider Note   CSN: [id number] Arrival date & time: 04/07/23  1100     History  Chief Complaint  Patient presents with   Seizures   Headache    Timothy Haynes is a 32 y.o. male.   Seizures Headache Associated symptoms: seizures      Patient has a history of seizures who presents ED with a recurrent seizure.  Patient takes Trileptal.  He has been taking his medications regularly.  Family noted the patient was having a generalized tonic-clonic seizure that lasted for couple of minutes.  Patient now complains of severe headache which she has had in the past related to his seizures.  He was given a dose of diazepam prior to EMS arrival by family.  Patient denies any numbness or weakness.  He does complain of severe headache.  No fevers or chills.  No recent illnesses.  Home Medications Prior to Admission medications   Medication Sig Start Date End Date Taking? Authorizing Provider  diazePAM, 20 MG Dose, (VALTOCO 20 MG DOSE) 2 x 10 MG/0.1ML LQPK Place 1 spray into the nose as needed (For seizure lasting over 2 mins). 12/31/22   Danford, Earl Lites, MD  Oxcarbazepine (TRILEPTAL) 300 MG tablet Take 2 tablets (600 mg total) by mouth 2 (two) times daily. 12/31/22 12/26/23  DanfordEarl Lites, MD      Allergies    Patient has no known allergies.    Review of Systems   Review of Systems  Neurological:  Positive for seizures and headaches.    Physical Exam Updated Vital Signs BP 128/64   Pulse (!) 50   Temp 98.9 F (37.2 C) (Oral)   Resp 13   Ht 1.676 m (5\' 6" )   Wt 123.8 kg   SpO2 96%   BMI 44.06 kg/m  Physical Exam Vitals and nursing note reviewed.  Constitutional:      Appearance: He is well-developed.     Comments: Appears to be in pain, uncomfortable  HENT:     Head: Normocephalic and atraumatic.     Right Ear: External ear normal.     Left Ear: External ear normal.  Eyes:     General: No scleral  icterus.       Right eye: No discharge.        Left eye: No discharge.     Conjunctiva/sclera: Conjunctivae normal.  Neck:     Trachea: No tracheal deviation.  Cardiovascular:     Rate and Rhythm: Normal rate and regular rhythm.  Pulmonary:     Effort: Pulmonary effort is normal. No respiratory distress.     Breath sounds: Normal breath sounds. No stridor. No wheezing or rales.  Abdominal:     General: Bowel sounds are normal. There is no distension.     Palpations: Abdomen is soft.     Tenderness: There is no abdominal tenderness. There is no guarding or rebound.  Musculoskeletal:        General: No tenderness or deformity.     Cervical back: Neck supple.  Skin:    General: Skin is warm and dry.     Findings: No rash.  Neurological:     General: No focal deficit present.     Mental Status: He is alert.     Cranial Nerves: No cranial nerve deficit, dysarthria or facial asymmetry.     Sensory: No sensory deficit.     Motor: No abnormal muscle tone or seizure  activity.     Coordination: Coordination normal.  Psychiatric:        Mood and Affect: Mood normal.     ED Results / Procedures / Treatments   Labs (all labs ordered are listed, but only abnormal results are displayed) Labs Reviewed  BASIC METABOLIC PANEL - Abnormal; Notable for the following components:      Result Value   CO2 19 (*)    Anion gap 17 (*)    All other components within normal limits  CBC    EKG None  Radiology CT Head Wo Contrast  Result Date: 04/07/2023 CLINICAL DATA:  Provided history: Headache, sudden, severe. Additional history provided: Seizure with subsequent headache. History of seizures. EXAM: CT HEAD WITHOUT CONTRAST TECHNIQUE: Contiguous axial images were obtained from the base of the skull through the vertex without intravenous contrast. RADIATION DOSE REDUCTION: This exam was performed according to the departmental dose-optimization program which includes automated exposure control,  adjustment of the mA and/or kV according to patient size and/or use of iterative reconstruction technique. COMPARISON:  Brain MRI 12/30/2022.  Head CT 12/30/2022. FINDINGS: Brain: Cerebral volume is normal. There is no acute intracranial hemorrhage. No demarcated cortical infarct. No extra-axial fluid collection. No evidence of an intracranial mass. No midline shift. Vascular: No hyperdense vessel. Skull: No calvarial fracture or aggressive osseous lesion. Sinuses/Orbits: No mass or acute finding within the imaged orbits. Mild mucosal thickening within the right maxillary sinus at the imaged levels. Extensive opacification of anterior ethmoid air cells on the right. Mild mucosal thickening within the inferior frontal sinuses, bilaterally. IMPRESSION: 1. No evidence of an acute intracranial abnormality. 2. Paranasal sinus disease at the imaged levels as described. Electronically Signed   By: Jackey Loge D.O.   On: 04/07/2023 13:42    Procedures Procedures    Medications Ordered in ED Medications  morphine (PF) 4 MG/ML injection 4 mg (4 mg Intravenous Given 04/07/23 1143)  ondansetron (ZOFRAN) injection 4 mg (4 mg Intravenous Given 04/07/23 1142)  morphine (PF) 4 MG/ML injection 4 mg (4 mg Intravenous Given 04/07/23 1323)    ED Course/ Medical Decision Making/ A&P Clinical Course as of 04/07/23 1433  Thu Apr 07, 2023  1350 CBC and metabolic panel normal. [JK]  1350 Head CT without acute abnormality [JK]    Clinical Course User Index [JK] Linwood Dibbles, MD                             Medical Decision Making Problems Addressed: Seizure disorder [facility]): acute illness or injury that poses a threat to life or bodily functions  Amount and/or Complexity of Data Reviewed Labs: ordered. Decision-making details documented in ED Course. Radiology: ordered and independent interpretation performed.  Risk Prescription drug management.   Patient presented to the ED for evaluation after a seizure this  morning.  Patient does have history of seizures.  No signs of acute infection.  No neurologic dysfunction.  Patient was complaining of severe headache so a CT scan was performed.  CT without acute abnormalities.  Patient's headache has improved.  He states he has had this in the past after seizures.  During the ED and no evidence of recurrent seizures.  Patient states he has been compliant with his medications.  No clear precipitating cause for his seizures.  Will have him continue his current medications and follow-up with his neurologist to see if he requires adjustment of his medication regimen.  Final Clinical Impression(s) / ED Diagnoses Final diagnoses:  Seizure disorder (HCC)    Rx / DC Orders ED Discharge Orders          Ordered    Ambulatory referral to Neurology       Comments: An appointment is requested in approximately: 2 weeks   04/07/23 1432              Linwood Dibbles, MD 04/07/23 1434

## 2023-04-07 NOTE — Discharge Instructions (Addendum)
Continue your current medications.  Follow-up with your neurologist to be rechecked.  Return to the ED as needed for recurrent episodes

## 2023-04-07 NOTE — ED Notes (Signed)
Pt verbalized understanding of discharge instructions. Opportunity for questions provided.  

## 2023-04-08 ENCOUNTER — Telehealth: Payer: Self-pay | Admitting: Neurology

## 2023-04-08 NOTE — Telephone Encounter (Signed)
Pt stated he had a seizure on 7/25 but he don't remember anything. Stated he ended up at the ER

## 2023-04-11 NOTE — Telephone Encounter (Signed)
I tried to call patient and lvm per dpr Requesting a CB or MC message

## 2023-04-11 NOTE — Telephone Encounter (Deleted)
Hello,  I also tried to reach out via phone call, I am trying to check in and see how you have been doing since the ER? Do you know if you missed any doses of medication prior to the seizure? Thanks, Pharmacist, hospital

## 2023-04-12 ENCOUNTER — Other Ambulatory Visit: Payer: Self-pay | Admitting: Neurology

## 2023-04-12 MED ORDER — OXCARBAZEPINE 600 MG PO TABS: 900.0000 mg | ORAL_TABLET | Freq: Two times a day (BID) | ORAL | 3 refills | Status: DC

## 2023-04-12 NOTE — Telephone Encounter (Signed)
I took incoming phone call from patient to report that he went to ER for seizure on 07.25, he denies missing any doses, but did have residual headache following, notes the ER did not change any doses of medications. He does report every night or every other night he will wake up gasping for air and he is not sure why this occurs but doesn't know if it could be seizures. Wants to know if anything needs to be changed or followed up on

## 2023-04-12 NOTE — Progress Notes (Signed)
Had a breakthough seizure on 7/25. Will increase Trileptal to 900 mg BID

## 2023-04-12 NOTE — Telephone Encounter (Signed)
Lmtcb.

## 2023-04-12 NOTE — Telephone Encounter (Signed)
Please call and advise the patient to increase the Trileptal to 900 mg BID (3 tabs). I have also sent him a new prescription 1.5 (600 mg tablet) to take twice daily.   Thanks

## 2023-04-12 NOTE — Telephone Encounter (Signed)
Called and relayed message to patient. Pt had no questions at this time but was encouraged to call back if questions arise. Pt verbalized understanding.

## 2023-04-21 ENCOUNTER — Other Ambulatory Visit: Payer: Self-pay | Admitting: Neurology

## 2023-04-21 MED ORDER — OXCARBAZEPINE 600 MG PO TABS: 900.0000 mg | ORAL_TABLET | Freq: Two times a day (BID) | ORAL | 3 refills | Status: DC

## 2023-04-21 NOTE — Telephone Encounter (Signed)
Pt is needing a refill request for his  Oxcarbazepine (TRILEPTAL) 600 MG tablet sent in to the [facility] on [address]

## 2023-04-21 NOTE — Telephone Encounter (Signed)
Phone room: notify pt refill sent

## 2023-04-25 NOTE — Telephone Encounter (Signed)
I tried to call pharmacy and was on hold for over 30 minutes and no one answered. Will try again at a later time

## 2023-04-25 NOTE — Telephone Encounter (Signed)
Patients wife called in regarding this refill request. I advised her that it was sent in on 8/8, however she states she spoke with the pharmacy yesterday and they did not receive it

## 2023-04-25 NOTE — Telephone Encounter (Signed)
Phone room: please clarify what pharmacy that they are using so I can send refill

## 2023-04-25 NOTE — Addendum Note (Signed)
Addended by: Danne Harbor on: 04/25/2023 03:26 PM   Modules accepted: Orders

## 2023-04-25 NOTE — Telephone Encounter (Signed)
Requested Prescriptions   Pending Prescriptions Disp Refills   oxcarbazepine (TRILEPTAL) 600 MG tablet 120 tablet 3    Sig: Take 1.5 tablets (900 mg total) by mouth 2 (two) times daily.   Signed Prescriptions Disp Refills   oxcarbazepine (TRILEPTAL) 600 MG tablet 120 tablet 3    Sig: Take 1.5 tablets (900 mg total) by mouth 2 (two) times daily.    Authorizing Provider: Teresa Coombs, AMADOU    Ordering User: Eather Colas E  Pt is requesting refill to soon:  Dispenses   Dispensed Days Supply Quantity Provider Pharmacy  OXCARBAZEPIN 600MG  TAB 04/12/2023 90 270 tablet Windell Norfolk, MD [facility]...  OXCARBAZEPIN 600MG  TAB 02/02/2023 90 180 tablet Danford, Earl Lites, MD [facility] #...  OXCARBAZEPIN 600MG  TAB 12/31/2022 30 60 tablet Danford, Earl Lites, MD [facility] #...  OXCARBAZEPIN 300MG  TAB 12/24/2022 30 60 tablet Windell Norfolk, MD [facility] #.[facility]

## 2023-04-26 MED ORDER — OXCARBAZEPINE 600 MG PO TABS: 900.0000 mg | ORAL_TABLET | Freq: Two times a day (BID) | ORAL | 3 refills | Status: DC

## 2023-05-17 NOTE — H&P (Signed)
Date: March 03, 2023   Patient: Timothy Haynes  PID: [id number]  DOB: 08/10/1991  SEX: Male   Patient referred by DDS for extraction teeth (referral not available)  CC: Pain upper right teeth.  Past Medical History:  High Blood Pressure, Smoker, Epilepsy, Morbid Obesity    Medications: Oxcarbazepine    Allergies:     None    Surgeries:   Foot surgery     Social History       Smoking: vape           Alcohol: Drug use:                             Exam: BMI  45. Gross decay # 2, Erupted # 16, 17, 32, large decay # 31.  No purulence, edema, fluctuance, trismus. Oral cancer screening negative. Pharynx clear. No lymphadenopathy.  Panorex: Not clear.  Assessment: ASA 3 Non-restorable  2, 16, 17, 31, 32.             Plan: Extraction Teeth # 2, 16, 17, 31, 32.             Hospital Day surgery.                 Rx: Amoxil              Risks and complications explained. Questions answered.   Georgia Lopes, DMD

## 2023-05-18 ENCOUNTER — Encounter (HOSPITAL_COMMUNITY): Payer: Self-pay | Admitting: Oral Surgery

## 2023-05-18 NOTE — Progress Notes (Signed)
Anesthesia Chart Review: Same-day workup  32 year old male with history of seizures followed by neurologist Dr. Teresa Coombs.  He had new onset of epilepsy in January 2024.  EEG 11/24/2022 showed presence of left temporal sharp and slow wave discharges consistent with an area of increased epileptogenic potential in the left temporal region.  Brain MRI 12/30/2022 showed no acute intracranial process, no seizure etiology identified.  He was initially started on Keppra but due to side effects was switched to oxcarbazepine.  He had a breakthrough seizure on 04/07/2023 and Dr. Teresa Coombs recommend increasing oxcarbazepine to 900 mg twice daily.  Patient reports no additional seizures since increasing dose.  Patient will need day of surgery labs and evaluation.  EKG 12/30/2022: Sinus rhythm.  Rate 80.   Zannie Cove [facility] Stay [facility]/Anesthesiology Phone [phone] 05/18/2023 3:10 PM

## 2023-05-18 NOTE — Progress Notes (Signed)
SDW call  Patient's wife, Elease Hashimoto was given pre-op instructions over the phone. She verbalized understanding of instructions provided.     PCP - denies Neurology: Dr. Windell [place] Cardiologist - denies Pulmonary:    PPM/ICD - denies Device Orders - n/a Rep Notified - n/a   Chest x-ray - 11/05/2022 EKG -  12/31/2022 Stress Test - ECHO -  Cardiac Cath -   Sleep Study/sleep apnea/CPAP: denies  Non-diabetic   Blood Thinner Instructions: denies Aspirin Instructions:denies   ERAS Protcol - NPO  COVID TEST- n/a    Anesthesia review: Yes. Seizures    Your procedure is scheduled on Thursday May 19, 2023  Report to [facility] Main Entrance "A" at 1300 PM  then check in with the Admitting office.  Call this number if you have problems the morning of surgery:  [phone]   If you have any questions prior to your surgery date call [phone]: Open Monday-Friday 8am-4pm If you experience any cold or flu symptoms such as cough, fever, chills, shortness of breath, etc. between now and your scheduled surgery, please notify us at the above number     Remember:  Do not eat or drink after midnight the night before your surgery  Take these medicines the morning of surgery with A SIP OF WATER:  Trileptal  As needed: Diazepam  As of today, STOP taking any Aspirin (unless otherwise instructed by your surgeon) Aleve, Naproxen, Ibuprofen, Motrin, Advil, Goody's, BC's, all herbal medications, fish oil, and all vitamins.

## 2023-05-18 NOTE — Anesthesia Preprocedure Evaluation (Signed)
Anesthesia Evaluation  Patient identified by MRN, date of birth, ID band Patient awake    Reviewed: Allergy & Precautions, H&P , NPO status , Patient's Chart, lab work & pertinent test results  Airway Mallampati: I  TM Distance: >3 FB Neck ROM: Full    Dental no notable dental hx. (+) Teeth Intact, Poor Dentition, Dental Advisory Given   Pulmonary neg pulmonary ROS, Current Smoker   Pulmonary exam normal breath sounds clear to auscultation       Cardiovascular Exercise Tolerance: Good negative cardio ROS Normal cardiovascular exam Rhythm:Regular Rate:Normal     Neuro/Psych Seizures -, Well Controlled,  negative neurological ROS  negative psych ROS   GI/Hepatic negative GI ROS, Neg liver ROS,,,  Endo/Other  negative endocrine ROS  Morbid obesity  Renal/GU negative Renal ROS  negative genitourinary   Musculoskeletal negative musculoskeletal ROS (+)    Abdominal   Peds negative pediatric ROS (+)  Hematology negative hematology ROS (+)   Anesthesia Other Findings   Reproductive/Obstetrics negative OB ROS                             Anesthesia Physical Anesthesia Plan  ASA: 2  Anesthesia Plan: General   Post-op Pain Management: Tylenol PO (pre-op)* and Celebrex PO (pre-op)*   Induction: Intravenous  PONV Risk Score and Plan: 2 and Ondansetron, Dexamethasone and Treatment may vary due to age or medical condition  Airway Management Planned: Oral ETT and Nasal ETT  Additional Equipment:   Intra-op Plan:   Post-operative Plan: Extubation in OR  Informed Consent: I have reviewed the patients History and Physical, chart, labs and discussed the procedure including the risks, benefits and alternatives for the proposed anesthesia with the patient or authorized representative who has indicated his/her understanding and acceptance.       Plan Discussed with:   Anesthesia Plan  Comments: (PAT note by Antionette Poles, PA-C: 32 year old male with history of seizures followed by neurologist Dr. Teresa Coombs.  He had new onset of epilepsy in January 2024.  EEG 11/24/2022 showed presence of left temporal sharp and slow wave discharges consistent with an area of increased epileptogenic potential in the left temporal region.  Brain MRI 12/30/2022 showed no acute intracranial process, no seizure etiology identified.  He was initially started on Keppra but due to side effects was switched to oxcarbazepine.  He had a breakthrough seizure on 04/07/2023 and Dr. Teresa Coombs recommend increasing oxcarbazepine to 900 mg twice daily.  Patient reports no additional seizures since increasing dose.  Patient will need day of surgery labs and evaluation.  EKG 12/30/2022: Sinus rhythm.  Rate 80.  )        Anesthesia Quick Evaluation

## 2023-05-19 ENCOUNTER — Encounter (HOSPITAL_COMMUNITY)
Admission: RE | Disposition: A | Discharge status: Home / Self Care | Payer: Self-pay | Source: Home / Self Care | Attending: Oral Surgery

## 2023-05-19 ENCOUNTER — Ambulatory Visit (HOSPITAL_COMMUNITY): Payer: Medicaid Other | Admitting: Physician Assistant

## 2023-05-19 ENCOUNTER — Ambulatory Visit (HOSPITAL_BASED_OUTPATIENT_CLINIC_OR_DEPARTMENT_OTHER): Payer: Medicaid Other | Admitting: Physician Assistant

## 2023-05-19 ENCOUNTER — Encounter (HOSPITAL_COMMUNITY): Payer: Self-pay | Admitting: Oral Surgery

## 2023-05-19 ENCOUNTER — Other Ambulatory Visit: Payer: Self-pay

## 2023-05-19 ENCOUNTER — Ambulatory Visit (HOSPITAL_COMMUNITY)
Admission: RE | Admit: 2023-05-19 | Discharge: 2023-05-19 | Disposition: A | Discharge status: Home / Self Care | Payer: Medicaid Other | Attending: Oral Surgery | Admitting: Oral Surgery

## 2023-05-19 DIAGNOSIS — Z6841 Body Mass Index (BMI) 40.0 and over, adult: Secondary | ICD-10-CM | POA: Insufficient documentation

## 2023-05-19 DIAGNOSIS — K029 Dental caries, unspecified: Secondary | ICD-10-CM | POA: Insufficient documentation

## 2023-05-19 DIAGNOSIS — G40909 Epilepsy, unspecified, not intractable, without status epilepticus: Secondary | ICD-10-CM | POA: Insufficient documentation

## 2023-05-19 DIAGNOSIS — Z79899 Other long term (current) drug therapy: Secondary | ICD-10-CM | POA: Insufficient documentation

## 2023-05-19 DIAGNOSIS — F17210 Nicotine dependence, cigarettes, uncomplicated: Secondary | ICD-10-CM | POA: Diagnosis not present

## 2023-05-19 HISTORY — PX: TOOTH EXTRACTION: SHX859

## 2023-05-19 LAB — CBC
HCT: 46.4 % (ref 39.0–52.0)
Hemoglobin: 15.5 g/dL (ref 13.0–17.0)
MCH: 31.3 pg (ref 26.0–34.0)
MCHC: 33.4 g/dL (ref 30.0–36.0)
MCV: 93.5 fL (ref 80.0–100.0)
Platelets: 211 10*3/uL (ref 150–400)
RBC: 4.96 MIL/uL (ref 4.22–5.81)
RDW: 13.5 % (ref 11.5–15.5)
WBC: 8.9 10*3/uL (ref 4.0–10.5)
nRBC: 0 % (ref 0.0–0.2)

## 2023-05-19 LAB — BASIC METABOLIC PANEL
Anion gap: 10 (ref 5–15)
BUN: 8 mg/dL (ref 6–20)
CO2: 20 mmol/L — ABNORMAL LOW (ref 22–32)
Calcium: 8.8 mg/dL — ABNORMAL LOW (ref 8.9–10.3)
Chloride: 109 mmol/L (ref 98–111)
Creatinine, Ser: 0.94 mg/dL (ref 0.61–1.24)
GFR, Estimated: >60 mL/min (ref >60)
Glucose, Bld: 90 mg/dL (ref 70–99)
Potassium: 3.8 mmol/L (ref 3.5–5.1)
Sodium: 139 mmol/L (ref 135–145)

## 2023-05-19 SURGERY — DENTAL RESTORATION/EXTRACTIONS
Anesthesia: General | Site: Mouth

## 2023-05-19 MED ORDER — LIDOCAINE 2% (20 MG/ML) 5 ML SYRINGE
INTRAMUSCULAR | Status: DC | PRN
  Administered 2023-05-19: 100 mg via INTRAVENOUS

## 2023-05-19 MED ORDER — LIDOCAINE-EPINEPHRINE 2 %-1:100000 IJ SOLN
INTRAMUSCULAR | Status: AC
  Filled 2023-05-19: qty 1

## 2023-05-19 MED ORDER — CHLORHEXIDINE GLUCONATE 0.12 % MT SOLN: 15.0000 mL | Freq: Once | OROMUCOSAL | Status: AC

## 2023-05-19 MED ORDER — SUGAMMADEX SODIUM 200 MG/2ML IV SOLN
INTRAVENOUS | Status: DC | PRN
  Administered 2023-05-19: 510 mg via INTRAVENOUS

## 2023-05-19 MED ORDER — DEXAMETHASONE SODIUM PHOSPHATE 10 MG/ML IJ SOLN
INTRAMUSCULAR | Status: AC
  Filled 2023-05-19: qty 1

## 2023-05-19 MED ORDER — MIDAZOLAM HCL 2 MG/2ML IJ SOLN
INTRAMUSCULAR | Status: AC
  Filled 2023-05-19: qty 2

## 2023-05-19 MED ORDER — FENTANYL CITRATE (PF) 100 MCG/2ML IJ SOLN: 25.0000 ug | INTRAMUSCULAR | Status: DC | PRN

## 2023-05-19 MED ORDER — LIDOCAINE 2% (20 MG/ML) 5 ML SYRINGE
INTRAMUSCULAR | Status: AC
  Filled 2023-05-19: qty 5

## 2023-05-19 MED ORDER — ORAL CARE MOUTH RINSE: 15.0000 mL | Freq: Once | OROMUCOSAL | Status: AC

## 2023-05-19 MED ORDER — LIDOCAINE-EPINEPHRINE 2 %-1:100000 IJ SOLN
INTRAMUSCULAR | Status: DC | PRN
  Administered 2023-05-19: 14 mL

## 2023-05-19 MED ORDER — AMOXICILLIN 500 MG PO CAPS: 500.0000 mg | ORAL_CAPSULE | Freq: Three times a day (TID) | ORAL | 0 refills | Status: DC

## 2023-05-19 MED ORDER — PROPOFOL 10 MG/ML IV BOLUS
INTRAVENOUS | Status: AC
  Filled 2023-05-19: qty 20

## 2023-05-19 MED ORDER — OXYCODONE-ACETAMINOPHEN 5-325 MG PO TABS: 1.0000 | ORAL_TABLET | ORAL | 0 refills | Status: AC | PRN

## 2023-05-19 MED ORDER — FENTANYL CITRATE (PF) 250 MCG/5ML IJ SOLN
INTRAMUSCULAR | Status: AC
  Filled 2023-05-19: qty 5

## 2023-05-19 MED ORDER — ROCURONIUM BROMIDE 10 MG/ML (PF) SYRINGE
PREFILLED_SYRINGE | INTRAVENOUS | Status: DC | PRN
  Administered 2023-05-19: 60 mg via INTRAVENOUS

## 2023-05-19 MED ORDER — ONDANSETRON HCL 4 MG/2ML IJ SOLN
INTRAMUSCULAR | Status: DC | PRN
  Administered 2023-05-19: 4 mg via INTRAVENOUS

## 2023-05-19 MED ORDER — OXYCODONE HCL 5 MG PO TABS: 5.0000 mg | ORAL_TABLET | Freq: Once | ORAL | Status: DC | PRN

## 2023-05-19 MED ORDER — ESMOLOL HCL 100 MG/10ML IV SOLN
INTRAVENOUS | Status: DC | PRN
Start: 2023-05-19 — End: 2023-05-19
  Administered 2023-05-19: 20 mg via INTRAVENOUS

## 2023-05-19 MED ORDER — OXYCODONE HCL 5 MG/5ML PO SOLN: 5.0000 mg | Freq: Once | ORAL | Status: DC | PRN

## 2023-05-19 MED ORDER — CEFAZOLIN IN SODIUM CHLORIDE 3-0.9 GM/100ML-% IV SOLN
3.0000 g | INTRAVENOUS | Status: AC
  Administered 2023-05-19: 3 g via INTRAVENOUS

## 2023-05-19 MED ORDER — ROCURONIUM BROMIDE 10 MG/ML (PF) SYRINGE
PREFILLED_SYRINGE | INTRAVENOUS | Status: AC
  Filled 2023-05-19: qty 10

## 2023-05-19 MED ORDER — ACETAMINOPHEN 325 MG PO TABS: 325.0000 mg | ORAL_TABLET | ORAL | Status: DC | PRN

## 2023-05-19 MED ORDER — DEXAMETHASONE SODIUM PHOSPHATE 10 MG/ML IJ SOLN
INTRAMUSCULAR | Status: DC | PRN
  Administered 2023-05-19: 10 mg via INTRAVENOUS

## 2023-05-19 MED ORDER — MEPERIDINE HCL 25 MG/ML IJ SOLN: 6.2500 mg | INTRAMUSCULAR | Status: DC | PRN

## 2023-05-19 MED ORDER — PROPOFOL 1000 MG/100ML IV EMUL
INTRAVENOUS | Status: AC
  Filled 2023-05-19: qty 100

## 2023-05-19 MED ORDER — CHLORHEXIDINE GLUCONATE 0.12 % MT SOLN
OROMUCOSAL | Status: AC
  Administered 2023-05-19: 15 mL via OROMUCOSAL
  Filled 2023-05-19: qty 15

## 2023-05-19 MED ORDER — PROPOFOL 10 MG/ML IV BOLUS
INTRAVENOUS | Status: DC | PRN
Start: 2023-05-19 — End: 2023-05-19
  Administered 2023-05-19: 30 mg via INTRAVENOUS
  Administered 2023-05-19: 200 mg via INTRAVENOUS

## 2023-05-19 MED ORDER — OXYMETAZOLINE HCL 0.05 % NA SOLN
NASAL | Status: AC
  Filled 2023-05-19: qty 30

## 2023-05-19 MED ORDER — 0.9 % SODIUM CHLORIDE (POUR BTL) OPTIME
TOPICAL | Status: DC | PRN
  Administered 2023-05-19: 1000 mL

## 2023-05-19 MED ORDER — MIDAZOLAM HCL 2 MG/2ML IJ SOLN
INTRAMUSCULAR | Status: DC | PRN
  Administered 2023-05-19: 2 mg via INTRAVENOUS

## 2023-05-19 MED ORDER — FENTANYL CITRATE (PF) 250 MCG/5ML IJ SOLN
INTRAMUSCULAR | Status: DC | PRN
  Administered 2023-05-19 (×2): 100 ug via INTRAVENOUS
  Administered 2023-05-19: 50 ug via INTRAVENOUS

## 2023-05-19 MED ORDER — ONDANSETRON HCL 4 MG/2ML IJ SOLN
INTRAMUSCULAR | Status: AC
  Filled 2023-05-19: qty 2

## 2023-05-19 MED ORDER — ONDANSETRON HCL 4 MG/2ML IJ SOLN: 4.0000 mg | Freq: Once | INTRAMUSCULAR | Status: DC | PRN

## 2023-05-19 MED ORDER — CEFAZOLIN IN SODIUM CHLORIDE 3-0.9 GM/100ML-% IV SOLN
INTRAVENOUS | Status: AC
  Filled 2023-05-19: qty 100

## 2023-05-19 MED ORDER — SODIUM CHLORIDE 0.9 % IR SOLN
Status: DC | PRN
  Administered 2023-05-19: 1000 mL

## 2023-05-19 MED ORDER — ACETAMINOPHEN 160 MG/5ML PO SOLN: 325.0000 mg | ORAL | Status: DC | PRN

## 2023-05-19 MED ORDER — DEXMEDETOMIDINE HCL IN NACL 80 MCG/20ML IV SOLN
INTRAVENOUS | Status: DC | PRN
Start: 2023-05-19 — End: 2023-05-19
  Administered 2023-05-19: 12 ug via INTRAVENOUS

## 2023-05-19 MED ORDER — LACTATED RINGERS IV SOLN: INTRAVENOUS | Status: DC

## 2023-05-19 SURGICAL SUPPLY — 35 items
BAG COUNTER SPONGE SURGICOUNT (BAG) IMPLANT
BAG SPNG CNTER NS LX DISP (BAG)
BLADE SURG 15 STRL LF DISP TIS (BLADE) ×1 IMPLANT
BLADE SURG 15 STRL SS (BLADE) ×1
BUR CROSS CUT FISSURE 1.6 (BURR) ×1 IMPLANT
BUR EGG ELITE 4.0 (BURR) ×1 IMPLANT
CANISTER SUCT 3000ML PPV (MISCELLANEOUS) ×1 IMPLANT
COVER SURGICAL LIGHT HANDLE (MISCELLANEOUS) ×1 IMPLANT
GAUZE PACKING FOLDED 2 STR (GAUZE/BANDAGES/DRESSINGS) ×1 IMPLANT
GLOVE BIO SURGEON STRL SZ 6.5 (GLOVE) IMPLANT
GLOVE BIO SURGEON STRL SZ7 (GLOVE) IMPLANT
GLOVE BIO SURGEON STRL SZ8 (GLOVE) ×1 IMPLANT
GLOVE BIOGEL PI IND STRL 6.5 (GLOVE) IMPLANT
GLOVE BIOGEL PI IND STRL 7.0 (GLOVE) IMPLANT
GOWN STRL REUS W/ TWL LRG LVL3 (GOWN DISPOSABLE) ×1 IMPLANT
GOWN STRL REUS W/ TWL XL LVL3 (GOWN DISPOSABLE) ×1 IMPLANT
GOWN STRL REUS W/TWL LRG LVL3 (GOWN DISPOSABLE) ×1
GOWN STRL REUS W/TWL XL LVL3 (GOWN DISPOSABLE) ×1
IV NS 1000ML (IV SOLUTION) ×1
IV NS 1000ML BAXH (IV SOLUTION) ×1 IMPLANT
KIT BASIN OR (CUSTOM PROCEDURE TRAY) ×1 IMPLANT
KIT TURNOVER KIT B (KITS) ×1 IMPLANT
NDL HYPO 25GX1X1/2 BEV (NEEDLE) ×2 IMPLANT
NEEDLE HYPO 25GX1X1/2 BEV (NEEDLE) ×2 IMPLANT
NS IRRIG 1000ML POUR BTL (IV SOLUTION) ×1 IMPLANT
PAD ARMBOARD 7.5X6 YLW CONV (MISCELLANEOUS) ×1 IMPLANT
SLEEVE IRRIGATION ELITE 7 (MISCELLANEOUS) ×1 IMPLANT
SPIKE FLUID TRANSFER (MISCELLANEOUS) ×1 IMPLANT
SPONGE SURGIFOAM ABS GEL 12-7 (HEMOSTASIS) IMPLANT
SUT CHROMIC 3 0 PS 2 (SUTURE) ×1 IMPLANT
SYR BULB IRRIG 60ML STRL (SYRINGE) ×1 IMPLANT
SYR CONTROL 10ML LL (SYRINGE) ×1 IMPLANT
TRAY ENT MC OR (CUSTOM PROCEDURE TRAY) ×1 IMPLANT
TUBING IRRIGATION (MISCELLANEOUS) ×1 IMPLANT
YANKAUER SUCT BULB TIP NO VENT (SUCTIONS) ×1 IMPLANT

## 2023-05-19 NOTE — Discharge Instructions (Signed)
Take prescription pain medicine as needed. May add 1 Tylenol and/or 2 ibuprofen if additional pain relief needed. Try to discontinue prescription pain medicine, as it is a narcotic, and use only Tylenol and Ibuprofen (may take - 2 Tylenols and 2 Ibuprofen together every 4 hours).  Ice to affected area for 2-3 days. Off/on every 30 minutes while awake  Some bleeding is normal. Bite on moistened gauze with firm pressure for 30 minutes and change as needed. When bleeding has stopped, remove gauze from mouth.  Soft diet, advance as tolerated.  Warm salt water mouth rinses 4-5 times per day starting the day after surgery.  No smoking or vaping for 2 weeks.  For problems, call [phone].

## 2023-05-19 NOTE — H&P (Signed)
H&P documentation  -History and Physical Reviewed  -Patient has been re-examined  -No change in the plan of care  Scott Jensen  

## 2023-05-19 NOTE — Op Note (Signed)
05/19/2023  4:28 PM  PATIENT:  [name]  32 y.o. male  PRE-OPERATIVE DIAGNOSIS:  NON-RESTORABLE TEETH  #'S TWO, SIXTEEN, SEVENTEEN, THIRTY-ONE, THIRTY-TWO SECONDARY TO DENTAL CARIES; MORBID OBESITY  POST-OPERATIVE DIAGNOSIS:  SAME  PROCEDURE:  Procedure(s): DENTAL EXTRACTIONS NUMBER TWO, SIXTEEN, SEVENTEEN, THIRTY-ONE, THIRTY-TWO  SURGEON:  Surgeon(s): Ocie Doyne, DMD  ANESTHESIA:   local and general  EBL:  minimal  DRAINS: none   SPECIMEN:  No Specimen  COUNTS:  YES  PLAN OF CARE: Discharge to home after PACU  PATIENT DISPOSITION:  PACU - hemodynamically stable.   PROCEDURE DETAILS: Dictation #[id number]  Georgia Lopes, DMD 05/19/2023 4:28 PM

## 2023-05-19 NOTE — Op Note (Signed)
NAMEAH, DOOD MEDICAL RECORD NO: [id number] ACCOUNT NO: [id number] DATE OF BIRTH: [birth year] FACILITY: [facility] LOCATION: [facility] PHYSICIAN: Georgia Lopes, DDS  Operative Report   DATE OF PROCEDURE: 05/19/2023  PREOPERATIVE DIAGNOSIS:  Nonrestorable teeth numbers 2, 16, 17, 31, 32 secondary to dental caries, morbid obesity.  POSTOPERATIVE DIAGNOSIS:  Nonrestorable teeth numbers 2, 16, 17, 31, 32 secondary to dental caries, morbid obesity.  PROCEDURE:  Extraction teeth numbers 2, 16, 17, 31, 32.  SURGEON:  Georgia Lopes, DDS  ANESTHESIA:  General, nasal intubation, Dr. Romeo Apple attending.  DESCRIPTION OF PROCEDURE:  The patient was taken to the operating room and placed on the table in supine position.  General anesthesia was administered and nasal endotracheal tube was placed and secured.  The eyes were protected.  The patient was draped  for surgery.  Timeout was performed.  The posterior pharynx was suctioned and a throat pack was placed. A 2% lidocaine with epinephrine 1:100,000 was infiltrated in an inferior alveolar block on the right and left side and in buccal and palatal  infiltration around the maxillary teeth to be removed.  A bite block was placed on the right side of the mouth, the left side was operated first.  A 15 blade was used to make an incision around tooth #17 in the gingival sulcus with a posterior releasing  incision.  The 15 was then used to make an incision around tooth #16.  The periosteum was reflected from around these teeth.  Bone was removed around tooth #17 and around tooth #16 with a Stryker handpiece and fissure bur under irrigation. The teeth were  removed and the sockets were curetted and irrigated with 3-0 chromic.  The bite block was repositioned to the other side of the mouth and the right side was operated.  The 15 blade was used to make an incision around teeth numbers 2, 31 and 32 in the  gingival sulcus.  The periosteum was reflected from  around these teeth.  Tooth #2 required removal of bone and approximately around the roots.  Then, the tooth was elevated and removed with the dental 301 elevator.  The lower teeth were removed using the  Stryker handpiece to remove interproximal bone around tooth #31.  The tooth was elevated and removed with the dental forceps.  Then, tooth #32 was removed in a similar fashion. Tooth #31 required sectioning of the roots with a Stryker handpiece prior to  removal.  The right sockets were then irrigated and closed with 3-0 chromic.  The oral cavity was then irrigated and suctioned.  The throat pack was removed.  The patient was left under care of anesthesia for extubation and transport to recovery room  with plans for discharge to home through day surgery.  ESTIMATED BLOOD LOSS:  Minimum.  COMPLICATIONS:  None.  SPECIMENS:  None.  COUNTS:  Correct.   PUS D: 05/19/2023 4:31:44 pm T: 05/19/2023 5:31:00 pm  JOB: [id number]/ [id number]

## 2023-05-19 NOTE — Anesthesia Postprocedure Evaluation (Signed)
Anesthesia Post Note  Patient: Timothy Haynes  Procedure(s) Performed: DENTAL EXTRACTIONS NUMBER TWO, SIXTEEN, SEVENTEEN, THIRTY-ONE, THIRTY-TWO (Mouth)     Patient location during evaluation: PACU Anesthesia Type: General Level of consciousness: awake and alert Pain management: pain level controlled Vital Signs Assessment: post-procedure vital signs reviewed and stable Respiratory status: spontaneous breathing, nonlabored ventilation, respiratory function stable and patient connected to nasal cannula oxygen Cardiovascular status: blood pressure returned to baseline and stable Postop Assessment: no apparent nausea or vomiting Anesthetic complications: no  No notable events documented.  Last Vitals:  Vitals:   05/19/23 1700 05/19/23 1715  BP: 139/85 138/75  Pulse: 90 86  Resp: 12 15  Temp:  36.7 C  SpO2: 96% 96%    Last Pain:  Vitals:   05/19/23 1715  TempSrc:   PainSc: 0-No pain                 Theona Muhs

## 2023-05-19 NOTE — Transfer of Care (Signed)
Immediate Anesthesia Transfer of Care Note  Patient: OWENN LAFFIN  Procedure(s) Performed: DENTAL EXTRACTIONS NUMBER TWO, SIXTEEN, SEVENTEEN, THIRTY-ONE, THIRTY-TWO (Mouth)  Patient Location: PACU  Anesthesia Type:General  Level of Consciousness: awake, alert , oriented, patient cooperative, and responds to stimulation  Airway & Oxygen Therapy: Patient Spontanous Breathing and Patient connected to face mask oxygen  Post-op Assessment: Report given to RN, Post -op Vital signs reviewed and stable, and Patient moving all extremities  Post vital signs: Reviewed and stable  Last Vitals:  Vitals Value Taken Time  BP 148/86 05/19/23 1645  Temp 36.8 C 05/19/23 1645  Pulse 93 05/19/23 1657  Resp 15 05/19/23 1657  SpO2 97 % 05/19/23 1657  Vitals shown include unfiled device data.  Last Pain:  Vitals:   05/19/23 1309  TempSrc: Oral  PainSc: 0-No pain         Complications: No notable events documented.  *Report given to PACU RN for continued care and followup.

## 2023-05-19 NOTE — Anesthesia Procedure Notes (Addendum)
Procedure Name: Intubation Date/Time: 05/19/2023 3:57 PM  Performed by: Earlene Plater, CRNAPre-anesthesia Checklist: Patient identified, Emergency Drugs available, Suction available, Patient being monitored and Timeout performed Patient Re-evaluated:Patient Re-evaluated prior to induction Oxygen Delivery Method: Circle system utilized Preoxygenation: Pre-oxygenation with 100% oxygen Induction Type: IV induction Ventilation: Mask ventilation without difficulty and Oral airway inserted - appropriate to patient size Laryngoscope Size: Mac and 4 Grade View: Grade II Nasal Tubes: Nasal prep performed, Left, Nasal Rae and Magill forceps- large, utilized Tube size: 7.5 mm Number of attempts: 2 Placement Confirmation: ETT inserted through vocal cords under direct vision, positive ETCO2, CO2 detector and breath sounds checked- equal and bilateral (Nasal Rae) Tube secured with: Tape Dental Injury: Teeth and Oropharynx as per pre-operative assessment  Comments: Baseline : poor dentition

## 2023-05-20 ENCOUNTER — Encounter (HOSPITAL_COMMUNITY): Payer: Self-pay | Admitting: Oral Surgery

## 2023-06-01 ENCOUNTER — Encounter (HOSPITAL_COMMUNITY): Payer: Self-pay | Admitting: Radiology

## 2023-06-01 ENCOUNTER — Emergency Department (HOSPITAL_COMMUNITY)
Admission: EM | Admit: 2023-06-01 | Discharge: 2023-06-02 | Discharge status: Against Medical Advice | Payer: Medicaid Other | Attending: Student | Admitting: Student

## 2023-06-01 ENCOUNTER — Emergency Department (HOSPITAL_COMMUNITY): Payer: Medicaid Other

## 2023-06-01 ENCOUNTER — Other Ambulatory Visit: Payer: Self-pay

## 2023-06-01 DIAGNOSIS — I10 Essential (primary) hypertension: Secondary | ICD-10-CM

## 2023-06-01 DIAGNOSIS — R61 Generalized hyperhidrosis: Secondary | ICD-10-CM | POA: Diagnosis not present

## 2023-06-01 DIAGNOSIS — R569 Unspecified convulsions: Secondary | ICD-10-CM

## 2023-06-01 LAB — CBC WITH DIFFERENTIAL/PLATELET
Abs Immature Granulocytes: 0.08 K/uL — ABNORMAL HIGH (ref 0.00–0.07)
Basophils Absolute: 0 K/uL (ref 0.0–0.1)
Basophils Relative: 0 %
Eosinophils Absolute: 0 K/uL (ref 0.0–0.5)
Eosinophils Relative: 0 %
HCT: 45.5 % (ref 39.0–52.0)
Hemoglobin: 15.1 g/dL (ref 13.0–17.0)
Immature Granulocytes: 1 %
Lymphocytes Relative: 17 %
Lymphs Abs: 1.8 K/uL (ref 0.7–4.0)
MCH: 30.8 pg (ref 26.0–34.0)
MCHC: 33.2 g/dL (ref 30.0–36.0)
MCV: 92.9 fL (ref 80.0–100.0)
Monocytes Absolute: 0.5 K/uL (ref 0.1–1.0)
Monocytes Relative: 5 %
Neutro Abs: 8 K/uL — ABNORMAL HIGH (ref 1.7–7.7)
Neutrophils Relative %: 77 %
Platelets: 218 K/uL (ref 150–400)
RBC: 4.9 MIL/uL (ref 4.22–5.81)
RDW: 13.2 % (ref 11.5–15.5)
WBC: 10.5 K/uL (ref 4.0–10.5)
nRBC: 0 % (ref 0.0–0.2)

## 2023-06-01 LAB — COMPREHENSIVE METABOLIC PANEL WITH GFR
ALT: 30 U/L (ref 0–44)
AST: 24 U/L (ref 15–41)
Albumin: 3.9 g/dL (ref 3.5–5.0)
Alkaline Phosphatase: 64 U/L (ref 38–126)
Anion gap: 14 (ref 5–15)
BUN: 8 mg/dL (ref 6–20)
CO2: 20 mmol/L — ABNORMAL LOW (ref 22–32)
Calcium: 9.3 mg/dL (ref 8.9–10.3)
Chloride: 107 mmol/L (ref 98–111)
Creatinine, Ser: 0.81 mg/dL (ref 0.61–1.24)
GFR, Estimated: >60 mL/min (ref >60)
Glucose, Bld: 112 mg/dL — ABNORMAL HIGH (ref 70–99)
Potassium: 4.2 mmol/L (ref 3.5–5.1)
Sodium: 141 mmol/L (ref 135–145)
Total Bilirubin: 0.5 mg/dL (ref 0.3–1.2)
Total Protein: 7 g/dL (ref 6.5–8.1)

## 2023-06-01 LAB — TROPONIN I (HIGH SENSITIVITY)
Troponin I (High Sensitivity): 3 ng/L (ref <18)
Troponin I (High Sensitivity): 5 ng/L

## 2023-06-01 LAB — CBG MONITORING, ED: Glucose-Capillary: 99 mg/dL (ref 70–99)

## 2023-06-01 MED ORDER — SODIUM CHLORIDE 0.9 % IV SOLN
2000.0000 mg | Freq: Once | INTRAVENOUS | Status: AC
  Administered 2023-06-01: 2000 mg via INTRAVENOUS
  Filled 2023-06-01: qty 20

## 2023-06-01 MED ORDER — AMLODIPINE BESYLATE 5 MG PO TABS: 5.0000 mg | ORAL_TABLET | Freq: Every day | ORAL | 2 refills | Status: DC

## 2023-06-01 MED ORDER — ACETAMINOPHEN 500 MG PO TABS
1000.0000 mg | ORAL_TABLET | Freq: Once | ORAL | Status: AC
  Administered 2023-06-01: 1000 mg via ORAL
  Filled 2023-06-01: qty 2

## 2023-06-01 MED ORDER — AMLODIPINE BESYLATE 5 MG PO TABS
5.0000 mg | ORAL_TABLET | Freq: Once | ORAL | Status: AC
  Administered 2023-06-01: 5 mg via ORAL
  Filled 2023-06-01: qty 1

## 2023-06-01 NOTE — ED Notes (Signed)
Pt transported to CT by Marylene Land, RN

## 2023-06-01 NOTE — ED Notes (Signed)
Contacted lab for phlebotomy in room 11

## 2023-06-01 NOTE — ED Notes (Signed)
Pt became diaphoretic, stating he feels like he is about to have another seizure. MD and PA to bedside. Verbal order for EKG, troponin and 2g Keppra. Suction set up in room and supplemental O2 at the bedside.

## 2023-06-01 NOTE — ED Notes (Signed)
Phlebotomy requested to draw repeat trop

## 2023-06-01 NOTE — Discharge Instructions (Signed)
You were seen in the emergency department after a seizure.  As we discussed, your blood work and CT scan look reassuring today. Your blood pressure was initially quite high, and we did give you some blood pressure medication I would like to start you on daily.   Please continue your seizure medication as prescribed.  Please refrain from using marijuana as it can increase your risk for seizures.   Per [facility] statutes, patients with seizures are not allowed to drive until  they have been seizure-free for six months. Use caution when using heavy equipment or power tools. Avoid working on ladders or at heights. Take showers instead of baths. Ensure the water temperature is not too high on the home water heater. Do not go swimming alone. When caring for infants or small children, sit down when holding, feeding, or changing them to minimize risk of injury to the child in the event you have a seizure.  Also, maintain good sleep hygiene. Avoid alcohol or other drugs.   Please follow up with your primary care provider regarding your visit today. If you do not have a primary care provider, you may reach out to [facility] at [phone] to establish with one and make your first appointment.  Continue to monitor how you're doing and return to the ER for new or worsening symptoms.

## 2023-06-01 NOTE — ED Triage Notes (Signed)
BIB EMS s/p seizure, pt non compliant with meds. Pt then had a seizure with EMS. No IV. Post ictal upon arrival. 134/70-110-18-CBG 103

## 2023-06-01 NOTE — ED Notes (Signed)
2 Golds Lt blue dark green sent with labs

## 2023-06-01 NOTE — ED Provider Notes (Signed)
[facility] EMERGENCY DEPARTMENT AT [facility] Provider Note   CSN: [id number] Arrival date & time: 06/01/23  1229     History Epilepsy  Chief Complaint  Patient presents with   Seizures    Timothy Haynes is a 32 y.o. male.  32 year old male with a past medical history of epilepsy presents to the ED via EMS status post seizure.  Seizure witnessed by his mother, who is currently not present in order to give any other history.  Patient does have a prior history of noncompliance with medication, however reports he has been taking his medication over the last couple days, currently on 600 mg of Trileptal.  He last took this medication this morning and previous to that last night.  He does smoke marijuana daily.  Multiple visits to the emergency department for the same.  Not having any chest pain, no weakness, no tingling sensation to upper or lower extremities.  The history is provided by the patient.  Seizures      Home Medications Prior to Admission medications   Medication Sig Start Date End Date Taking? Authorizing Provider  amoxicillin (AMOXIL) 500 MG capsule Take 1 capsule (500 mg total) by mouth 3 (three) times daily. 05/19/23   Ocie Doyne, DMD  diazePAM, 20 MG Dose, (VALTOCO 20 MG DOSE) 2 x 10 MG/0.1ML LQPK Place 1 spray into the nose as needed (For seizure lasting over 2 mins). 12/31/22   Danford, Earl Lites, MD  oxcarbazepine (TRILEPTAL) 600 MG tablet Take 1.5 tablets (900 mg total) by mouth 2 (two) times daily. 04/26/23 04/20/24  Windell Norfolk, MD  oxyCODONE-acetaminophen (PERCOCET) 5-325 MG tablet Take 1 tablet by mouth every 4 (four) hours as needed for severe pain. 05/19/23   Ocie Doyne, DMD      Allergies    Patient has no known allergies.    Review of Systems   Review of Systems  Constitutional:  Negative for chills and fever.  Respiratory:  Negative for shortness of breath.   Cardiovascular:  Negative for chest pain.  Gastrointestinal:  Negative for  abdominal pain, nausea and vomiting.  Musculoskeletal:  Negative for back pain.  Neurological:  Positive for seizures. Negative for dizziness and syncope.  All other systems reviewed and are negative.   Physical Exam Updated Vital Signs BP (!) 170/97   Pulse 61   Temp 98.6 F (37 C) (Oral)   Resp 14   Wt 126.6 kg   SpO2 97%   BMI 47.89 kg/m  Physical Exam Vitals and nursing note reviewed.  Constitutional:      Appearance: He is diaphoretic.  HENT:     Head: Normocephalic and atraumatic.     Comments: NO visible signs of trauma, does have small bite to the left side of her tongue.  Eyes:     Pupils: Pupils are equal, round, and reactive to light.     Comments: Pupils are equal and reactive.   Neck:     Comments: No TTP to the cervical spine.  Cardiovascular:     Rate and Rhythm: Normal rate.     Pulses:          Radial pulses are 2+ on the right side and 2+ on the left side.       Dorsalis pedis pulses are 2+ on the right side and 2+ on the left side.       Posterior tibial pulses are 2+ on the right side and 2+ on the left side.  Pulmonary:  Effort: Pulmonary effort is normal.  Abdominal:     General: Abdomen is flat.     Tenderness: There is no abdominal tenderness.  Musculoskeletal:     Cervical back: Normal range of motion and neck supple.  Neurological:     Mental Status: He is alert and oriented to person, place, and time.     Cranial Nerves: No facial asymmetry.     Sensory: Sensation is intact.     Motor: Motor function is intact.     Comments: Alert, oriented, thought content appropriate. Speech fluent without evidence of aphasia. Able to follow 2 step commands without difficulty.  Cranial Nerves:  II:  Peripheral visual fields grossly normal, pupils, round, reactive to light III,IV, VI: ptosis not present, extra-ocular motions intact bilaterally  V,VII: smile symmetric, facial light touch sensation equal VIII: hearing grossly normal bilaterally  IX,X:  midline uvula rise  XI: bilateral shoulder shrug equal and strong XII: midline tongue extension  Motor:  5/5 in upper and lower extremities bilaterally including strong and equal grip strength and dorsiflexion/plantar flexion Sensory: light touch normal in all extremities.  Cerebellar: normal finger-to-nose with bilateral upper extremities, pronator drift negative Gait: N/A       ED Results / Procedures / Treatments   Labs (all labs ordered are listed, but only abnormal results are displayed) Labs Reviewed  COMPREHENSIVE METABOLIC PANEL - Abnormal; Notable for the following components:      Result Value   CO2 20 (*)    Glucose, Bld 112 (*)    All other components within normal limits  CBC WITH DIFFERENTIAL/PLATELET - Abnormal; Notable for the following components:   Neutro Abs 8.0 (*)    Abs Immature Granulocytes 0.08 (*)    All other components within normal limits  RAPID URINE DRUG SCREEN, HOSP PERFORMED  10-HYDROXYCARBAZEPINE  CBG MONITORING, ED  TROPONIN I (HIGH SENSITIVITY)  TROPONIN I (HIGH SENSITIVITY)    EKG EKG Interpretation Date/Time:  Wednesday June 01 2023 13:37:59 EDT Ventricular Rate:  70 PR Interval:  190 QRS Duration:  94 QT Interval:  361 QTC Calculation: 390 R Axis:   85  Text Interpretation: Sinus rhythm Borderline repolarization abnormality ST elevation, consider lateral injury No significant change since last tracing Confirmed by Richardean Canal [phone]) on 06/01/2023 3:10:14 PM  Radiology No results found.  Procedures Procedures    Medications Ordered in ED Medications  amLODipine (NORVASC) tablet 5 mg (has no administration in time range)  levETIRAcetam (KEPPRA) 2,000 mg in sodium chloride 0.9 % 250 mL IVPB (2,000 mg Intravenous New Bag/Given 06/01/23 1434)    ED Course/ Medical Decision Making/ A&P                                 Medical Decision Making Amount and/or Complexity of Data Reviewed Labs: ordered. Radiology:  ordered.  Risk Prescription drug management.    This patient presents to the ED for concern of seizures, this involves a number of treatment options, and is a complaint that carries with it a high risk of complications and morbidity.  The differential diagnosis includes substance abuse, medication noncompliance versus hypertensive emergency.    Co morbidities: Discussed in HPI   Brief History:  See HPI.   EMR reviewed including pt PMHx, past surgical history and past visits to ER.   See HPI for more details   Lab Tests:  I ordered and independently interpreted labs.  The  pertinent results include:    I personally reviewed all laboratory work and imaging. Metabolic panel without any acute abnormality specifically kidney function within normal limits and no significant electrolyte abnormalities. CBC without leukocytosis or significant anemia.  Imaging Studies:  CT Head is pending  Cardiac Monitoring:  The patient was maintained on a cardiac monitor.  I personally viewed and interpreted the cardiac monitored which showed an underlying rhythm of: NSR EKG non-ischemic   Medicines ordered:  I ordered medication including Keppra  for seizure disorder Reevaluation of the patient after these medicines showed that the patient improved I have reviewed the patients home medicines and have made adjustments as needed  Reevaluation:  After the interventions noted above I re-evaluated patient and found that they have :improved  Social Determinants of Health:  The patient's social determinants of health were a factor in the care of this patient  Problem List / ED Course:  Patient with underlying history of epilepsy a currently on Trileptal 600 mg presents to the ED with a chief complaint of seizure episode.  Patient has a prior history of noncompliance although he tells me that he is now taking his medication at home.  However, also endorses marijuana use daily last use last  night.  He arrives to the ED diaphoretic hypertensive with no prior history of hypertension.  He is neurological exam is unremarkable, although he was postictal on prior arrival.  He moves all upper and lower extremities with pupils that are equal and reactive.  There are no signs of trauma to his head however does have a small injury to his left tongue likely from his seizure.  The seizure was witnessed by his mother however she was not at the bedside on my primary evaluation. Interpretation of his blood revealed CBC with no leukocytosis, hemoglobin is within normal limits.  CMP with no electrolyte derangement, current levels unremarkable.  LFTs are within normal limits.  Glucose on arrival was 99.  He was given a loading dose of Keppra 2 g.  He did have a CT head ordered as he had blood pressures elevated while in the emergency department, especially 1 systolic in the 200s, some concern for press versus other intracranial pathology.  Did not address blood pressure at this time as would like to make sure that these are negative.  He was diaphoretic along with hypertensive although equal pulses to the radial and DP, we did add a troponin and EKG that appear nonischemic.  Dispostion:  Patient care signed out to incoming provider pending the rest of the workup.  Portions of this note were generated with Scientist, clinical (histocompatibility and immunogenetics). Dictation errors may occur despite best attempts at proofreading.   Final Clinical Impression(s) / ED Diagnoses Final diagnoses:  Seizure [facility])    Rx / DC Orders ED Discharge Orders     None         Claude Manges, PA-C 06/01/23 1521    Glendora Score, MD 06/01/23 2101

## 2023-06-01 NOTE — ED Provider Triage Note (Signed)
Emergency Medicine Provider Triage Evaluation Note  Timothy Haynes , a 32 y.o. male  was evaluated in triage.  Pt complains of a seizure just prior to arrival.  It was witnessed by his mother, but mother is not at bedside.  Unknown how long the seizure lasted.  He is unsure of what medication he is on, but reports the oxy carbamazepine in his chart is an old medication.  States he has been having a seizure about once a month recently.  Review of Systems  Positive: As above Negative: As above  Physical Exam  BP (!) 182/139   Pulse 69   Temp 98.6 F (37 C) (Oral)   Resp 18   Wt 126.6 kg   SpO2 99%   BMI 47.89 kg/m  Gen:   Awake, postictal, able to answer questions appropriately Resp:  Normal effort  MSK:   Moves extremities without difficulty    Medical Decision Making  Medically screening exam initiated at 12:45 PM.  Appropriate orders placed.  AJDIN LALLO was informed that the remainder of the evaluation will be completed by another provider, this initial triage assessment does not replace that evaluation, and the importance of remaining in the ED until their evaluation is complete.     Arabella Merles, PA-C 06/01/23 1247

## 2023-06-01 NOTE — ED Provider Notes (Signed)
Accepted handoff at shift change from [facility]. Please see prior provider note for full HPI.  Briefly: Patient is a 32 y.o. male who presents to the ER for seizure. Hx of epilepsy on trileptal. Reports he has been taking his medication, but hx of noncompliance. Smoked marijuana last night. Came in very hypertensive and diaphoretic. Given 2 g keppra. Neurologic exam has improved, BP has improved without medication. Equal pulses in all extremities.   DDX/Plan: Pending troponin and CT head. Given 5 mg norvasc for hypertension. Reassess and hopefully dc with BP prescription.   Physical Exam  BP (!) 160/129   Pulse 61   Temp 98.6 F (37 C) (Oral)   Resp 20   Wt 126.6 kg   SpO2 97%   BMI 47.89 kg/m   Physical Exam Vitals and nursing note reviewed.  Constitutional:      Appearance: Normal appearance.  HENT:     Head: Normocephalic and atraumatic.  Eyes:     Conjunctiva/sclera: Conjunctivae normal.  Pulmonary:     Effort: Pulmonary effort is normal. No respiratory distress.  Skin:    General: Skin is warm and dry.  Neurological:     Mental Status: He is alert.     Comments: Neuro: Speech is clear, able to follow commands. CN III-XII intact grossly intact. PERRLA. EOMI. Sensation intact throughout. Str 5/5 all extremities.  Psychiatric:        Mood and Affect: Mood normal.        Behavior: Behavior normal.    ED Course / MDM    Medical Decision Making Amount and/or Complexity of Data Reviewed Labs: ordered. Radiology: ordered.  Upon reevaluation, patient states that he is having a mild headache, but has improved after getting some Tylenol earlier.  He states that when he came in he did have some chest pain, but this is completely resolved.  Also states that he is been previously told he had high blood pressure, but is never been started on medication for this.  CT head unremarkable. Initial troponin 3, delta troponin 5.  No acute ischemic changes on EKG. Low concern for  hypertensive urgency/emergency with downtrending BP readings and no evidence of end organ dysfunction.   After consideration of the diagnostic results and the patients response to treatment, I feel that emergency department workup does not suggest an emergent condition requiring admission or immediate intervention beyond what has been performed at this time. The plan is: discharge to home. Discussed the results with the patient.  His blood pressure was already coming down well before we gave him 5 mg of Norvasc.  I will send him home with prescription for the same.  Given [place] seizure restrictions.  Strongly encouraged to follow-up with PCP regarding medication management of epilepsy and now hypertension.  The patient is safe for discharge and has been instructed to return immediately for worsening symptoms, change in symptoms or any other concerns.  I discussed this case with my attending physician Dr. Silverio Lay who cosigned this note including patient's presenting symptoms, physical exam, and planned diagnostics and interventions. Attending physician stated agreement with plan or made changes to plan which were implemented.    Jeanella Flattery 06/01/23 1833    Charlynne Pander, MD 06/01/23 2322

## 2023-06-03 ENCOUNTER — Telehealth: Payer: Self-pay | Admitting: Neurology

## 2023-06-03 NOTE — Telephone Encounter (Signed)
Tanesa , NP at [facility]  has called to report that she has seen pt re: recent seizure activity and complaint of headaches.  She has suggested RN calls pt to discuss these issues and to see if Dr Teresa Coombs would want to see him earlier.  She can be reached at [phone] if there are questions for her

## 2023-06-06 NOTE — Telephone Encounter (Signed)
Returned call to pt who stated that he experienced headache and dizziness and sz on 06/02/23. Pt verified correct dosing of trileptal and stated that he didn't miss any doses. Pt stated that he didn't have the valtoco at the time of sz but now that he has some.

## 2023-06-06 NOTE — Telephone Encounter (Signed)
Please schedule him for an appointment. He did not schedule his follow up visit.

## 2023-06-07 LAB — OXCARBAZEPINE (TRILEPTAL), SERUM: Oxcarbazepine Metabolite: 17 ug/mL (ref 10–35)

## 2023-08-14 NOTE — Progress Notes (Unsigned)
  Cardiology Office Note:  .   Date:  08/14/2023  ID:  Timothy Haynes, DOB August 08, 1991, MRN [id number] PCP: Pcp, No  [facility] Providers Cardiologist:  None { Click to update primary MD,subspecialty MD or APP then REFRESH:1}   History of Present Illness: .   Timothy Haynes is a 32 y.o. male with history of HTN who presents for the evaluation of abnormal EKG at the request of Timothy Haynes*.     Problem List HTN    ROS: All other ROS reviewed and negative. Pertinent positives noted in the HPI.     Studies Reviewed: Marland Kitchen       Physical Exam:   VS:  There were no vitals taken for this visit.   Wt Readings from Last 3 Encounters:  06/01/23 279 lb (126.6 kg)  05/19/23 279 lb (126.6 kg)  04/07/23 273 lb (123.8 kg)    GEN: Well nourished, well developed in no acute distress NECK: No JVD; No carotid bruits CARDIAC: ***RRR, no murmurs, rubs, gallops RESPIRATORY:  Clear to auscultation without rales, wheezing or rhonchi  ABDOMEN: Soft, non-tender, non-distended EXTREMITIES:  No edema; No deformity  ASSESSMENT AND PLAN: .   ***    {Are you ordering a CV Procedure (e.g. stress test, cath, DCCV, TEE, etc)?   Press F2        :[id number]}   Follow-up: No follow-ups on file.  Time Spent with Patient: I have spent a total of *** minutes caring for this patient today face to face, ordering and reviewing labs/tests, reviewing prior records/medical history, examining the patient, establishing an assessment and plan, communicating results/findings to the patient/family, and documenting in the medical record.   Signed, Lenna Gilford. Flora Lipps, MD, [facility]  [facility]  [address] [place], [place] [address] [phone]  8:47 PM

## 2023-08-16 ENCOUNTER — Ambulatory Visit: Payer: Medicaid Other | Attending: Cardiovascular Disease | Admitting: Cardiovascular Disease

## 2023-08-16 DIAGNOSIS — I10 Essential (primary) hypertension: Secondary | ICD-10-CM

## 2023-08-16 DIAGNOSIS — R9431 Abnormal electrocardiogram [ECG] [EKG]: Secondary | ICD-10-CM

## 2023-10-18 ENCOUNTER — Ambulatory Visit (INDEPENDENT_AMBULATORY_CARE_PROVIDER_SITE_OTHER): Payer: Medicaid Other | Admitting: Neurology

## 2023-10-18 ENCOUNTER — Encounter: Payer: Self-pay | Admitting: Neurology

## 2023-10-18 VITALS — BP 153/94 | HR 79 | Resp 14 | Ht 64.0 in | Wt 279.5 lb

## 2023-10-18 DIAGNOSIS — Z5181 Encounter for therapeutic drug level monitoring: Secondary | ICD-10-CM | POA: Diagnosis not present

## 2023-10-18 DIAGNOSIS — G40909 Epilepsy, unspecified, not intractable, without status epilepticus: Secondary | ICD-10-CM | POA: Diagnosis not present

## 2023-10-18 MED ORDER — AMLODIPINE BESYLATE 10 MG PO TABS: 10.0000 mg | ORAL_TABLET | Freq: Every day | ORAL | 2 refills | Status: AC

## 2023-10-18 MED ORDER — OXCARBAZEPINE 600 MG PO TABS: 900.0000 mg | ORAL_TABLET | Freq: Two times a day (BID) | ORAL | 3 refills | Status: DC

## 2023-10-18 NOTE — Progress Notes (Signed)
 [facility]  PATIENT: Timothy Haynes DOB: 1991/03/11  REQUESTING CLINICIAN: No ref. provider found HISTORY FROM: Patient and family  REASON FOR VISIT: New onset seizures    HISTORICAL  CHIEF COMPLAINT:  Chief Complaint  Patient presents with   Seizures    Rm12, wife present, Sz: lats sz 06/01/23 and they denied missing medication   INTERVAL HISTORY 10/18/2023:  Timothy Haynes presents today for follow up. He is accompanied by spouse. Last visit was in June, at that time we have increase his Trileptal  to 900 mg BID. He reports doing well until having a breakthrough seizure on September 18. Denies any provoking factors as he reports compliance with medication. Since then, he has been doing well, no other complaints of concerns.  They reports depression and irritability due to the fact that he has not been working. He would like to go back to work.     INTERVAL HISTORY 03/02/2023:  Patient presents today for follow-up, he is accompanied by wife.  Last visit was in March, at that time we have switched his Keppra  to oxcarbazepine  due to side effect from Keppra .  Unfortunately he did have a seizure on April 18, a total of 3 seizures that day.  His oxcarbazepine  dose was increased from 300 to 600 mg twice daily.  Since then he has been doing well, denies any side effect from the medication.  Wife reports that his mood is much better since being off Keppra .  He would like to go back to work.  No other complaints no other concerns.    HISTORY OF PRESENT ILLNESS:  This is a 33 year old gentleman with no reported past medical history who is presenting for new onset epilepsy.  Patient reports going to bed the night of January 31 and waking up on the floor with tongue biting.  He was confused, he presented to the ED and there was a suspicion for seizures.  He was recommended to start Keppra  500 mg twice daily and discharged home.  At that time he reports that he did not take the Keppra .   On February 23 he had another event witnessed by wife.  Again it was during sleep and wife woke up in bed shaking.  He was also making a grunting noise.  He did have tongue biting and the EMS was called.  Seizure was described as generalized convulsion. Wife reports patient was confused initially.  In the ED workup unrevealing, he was recommended Keppra  500 mg twice daily.  Patient reports since discharge from the hospital he has been compliant with the Keppra  500 mg twice daily, denies any seizure or seizure activity.  Denies any side effect from the medicine.  He denies any seizure risk factors, denies any family history of seizures.   Handedness Right handed    Onset: January 31  Seizure Type: generalized convulsion   Current frequency: 2 seizure reported   Any injuries from seizures: Tongue biting   Seizure risk factors: None   Previous ASMs: levetiracetam , Oxcarbazepine     Currenty ASMs: Oxcarbazepine  900 mg twice daily   ASMs side effects: Mood swing, and irritability with Keppra .  Brain Images: Normal head CT   Previous EEGs: Left temporal epileptiform discharges    OTHER MEDICAL CONDITIONS: None reported   REVIEW OF SYSTEMS: Full 14 system review of systems performed and negative with exception of: As noted in the HPI   ALLERGIES: No Known Allergies  HOME MEDICATIONS: Outpatient Medications Prior to Visit  Medication Sig Dispense  Refill   diazePAM , 20 MG Dose, (VALTOCO  20 MG DOSE) 2 x 10 MG/0.1ML LQPK Place 1 spray into the nose as needed (For seizure lasting over 2 mins). 6 each 0   oxyCODONE -acetaminophen  (PERCOCET) 5-325 MG tablet Take 1 tablet by mouth every 4 (four) hours as needed for severe pain. 18 tablet 0   amLODipine  (NORVASC ) 5 MG tablet Take 1 tablet (5 mg total) by mouth daily. 30 tablet 2   oxcarbazepine  (TRILEPTAL ) 600 MG tablet Take 1.5 tablets (900 mg total) by mouth 2 (two) times daily. 120 tablet 3   amoxicillin  (AMOXIL ) 500 MG capsule Take 1  capsule (500 mg total) by mouth 3 (three) times daily. 21 capsule 0   No facility-administered medications prior to visit.    PAST MEDICAL HISTORY: Past Medical History:  Diagnosis Date   Seizures (HCC)     PAST SURGICAL HISTORY: Past Surgical History:  Procedure Laterality Date   FOOT SURGERY     TOOTH EXTRACTION N/A 05/19/2023   Procedure: DENTAL EXTRACTIONS NUMBER TWO, SIXTEEN, SEVENTEEN, THIRTY-ONE, THIRTY-TWO;  Surgeon: Sheryle Hamilton, DMD;  Location: [facility] OR;  Service: Oral Surgery;  Laterality: N/A;    FAMILY HISTORY: Family History  Problem Relation Age of Onset   Hypertension Mother    Hypertension Father    Kidney disease Father    Diabetes Maternal Grandmother     SOCIAL HISTORY: Social History   Socioeconomic History   Marital status: Single    Spouse name: Not on file   Number of children: Not on file   Years of education: Not on file   Highest education level: Not on file  Occupational History   Not on file  Tobacco Use   Smoking status: Every Day    Current packs/day: 0.50    Types: Cigarettes   Smokeless tobacco: Never  Vaping Use   Vaping status: Never Used  Substance and Sexual Activity   Alcohol use: Yes    Comment: occ   Drug use: No   Sexual activity: Yes    Comment: married and monagomous  Other Topics Concern   Not on file  Social History Narrative   Right handed   No caffeine   Lives at home with wife      Social Drivers of Corporate Investment Banker Strain: Not on file  Food Insecurity: No Food Insecurity (12/30/2022)   Hunger Vital Sign    Worried About Running Out of Food in the Last Year: Never true    Ran Out of Food in the Last Year: Never true  Transportation Needs: No Transportation Needs (12/30/2022)   PRAPARE - Administrator, Civil Service (Medical): No    Lack of Transportation (Non-Medical): No  Physical Activity: Not on file  Stress: Not on file  Social Connections: Not on file  Intimate Partner  Violence: Not At Risk (12/30/2022)   Humiliation, Afraid, Rape, and Kick questionnaire    Fear of Current or Ex-Partner: No    Emotionally Abused: No    Physically Abused: No    Sexually Abused: No     PHYSICAL EXAM   GENERAL EXAM/CONSTITUTIONAL: Vitals:  Vitals:   10/18/23 1348  BP: (!) 153/94  Pulse: 79  Resp: 14  Weight: 279 lb 8 oz (126.8 kg)  Height: 5' 4 (1.626 m)   Body mass index is 47.98 kg/m. Wt Readings from Last 3 Encounters:  10/18/23 279 lb 8 oz (126.8 kg)  06/01/23 279 lb (126.6 kg)  05/19/23  279 lb (126.6 kg)   Patient is in no distress; well developed, nourished and groomed; neck is supple, Obese gentleman   MUSCULOSKELETAL: Gait, strength, tone, movements noted in Neurologic exam below  NEUROLOGIC: MENTAL STATUS:      No data to display         awake, alert, oriented to person, place and time recent and remote memory intact normal attention and concentration language fluent, comprehension intact, naming intact fund of knowledge appropriate  CRANIAL NERVE:  2nd, 3rd, 4th, 6th - Visual fields full to confrontation, extraocular muscles intact, no nystagmus 5th - facial sensation symmetric 7th - facial strength symmetric 8th - hearing intact 9th - palate elevates symmetrically, uvula midline 11th - shoulder shrug symmetric 12th - tongue protrusion midline  MOTOR:  normal bulk and tone, full strength in the BUE, BLE  SENSORY:  normal and symmetric to light touch  COORDINATION:  finger-nose-finger, fine finger movements normal  REFLEXES:  deep tendon reflexes present and symmetric  GAIT/STATION:  normal    DIAGNOSTIC DATA (LABS, IMAGING, TESTING) - I reviewed patient records, labs, notes, testing and imaging myself where available.  Lab Results  Component Value Date   WBC 10.5 06/01/2023   HGB 15.1 06/01/2023   HCT 45.5 06/01/2023   MCV 92.9 06/01/2023   PLT 218 06/01/2023      Component Value Date/Time   NA 144  10/18/2023 1403   K 3.7 10/18/2023 1403   CL 107 (H) 10/18/2023 1403   CO2 20 10/18/2023 1403   GLUCOSE 94 10/18/2023 1403   GLUCOSE 112 (H) 06/01/2023 1256   BUN 11 10/18/2023 1403   CREATININE 1.00 10/18/2023 1403   CREATININE 0.86 09/29/2012 1201   CALCIUM 9.8 10/18/2023 1403   PROT 7.0 06/01/2023 1256   ALBUMIN 3.9 06/01/2023 1256   AST 24 06/01/2023 1256   ALT 30 06/01/2023 1256   ALKPHOS 64 06/01/2023 1256   BILITOT 0.5 06/01/2023 1256   GFRNONAA >60 06/01/2023 1256   GFRAA >60 06/06/2016 1815   Lab Results  Component Value Date   CHOL 124 02/06/2010   HDL 34 (L) 02/06/2010   LDLCALC 81 02/06/2010   TRIG 45 02/06/2010   No results found for: HGBA1C No results found for: VITAMINB12 Lab Results  Component Value Date   TSH 3.444 02/06/2010    Head CT 10/13/2022 Unremarkable head CT with no acute intracranial pathology.   MRI Brain 12/30/2022:  No acute intracranial process. No seizure etiology is identified.   EEG 11/24/2022 Frequent left temporal sharp and slow wave discharges   I personally reviewed brain Images   ASSESSMENT AND PLAN  33 y.o. year old male  with no reported past medical history who is presenting for follow up for his focal epilepsy.  He is doing well on oxcarbazepine  900 mg twice daily.  I will check a level and with a BMP.  His last seizure was 5 months ago, I think it is reasonable to patient to return to work. I write him a letter. Return in 6 months or sooner if worse.     1. Nonintractable epilepsy without status epilepticus, unspecified epilepsy type (HCC)   2. Therapeutic drug monitoring       Patient Instructions  Continue with Oxcarbazepine  900 mg twice daily  Continue your other medications Discuss weight management  Will write him a letter to return to work  Return in 6 months or sooner if worse     Per [place]  DMV statutes, patients  with seizures are not allowed to drive until they have been seizure-free for six  months.  Other recommendations include using caution when using heavy equipment or power tools. Avoid working on ladders or at heights. Take showers instead of baths.  Do not swim alone.  Ensure the water  temperature is not too high on the home water  heater. Do not go swimming alone. Do not lock yourself in a room alone (i.e. bathroom). When caring for infants or small children, sit down when holding, feeding, or changing them to minimize risk of injury to the child in the event you have a seizure. Maintain good sleep hygiene. Avoid alcohol.  Also recommend adequate sleep, hydration, good diet and minimize stress.   Discussed Patients with epilepsy have a small risk of sudden unexpected death, a condition referred to as sudden unexpected death in epilepsy (SUDEP). SUDEP is defined specifically as the sudden, unexpected, witnessed or unwitnessed, nontraumatic and nondrowning death in patients with epilepsy with or without evidence for a seizure, and excluding documented status epilepticus, in which post mortem examination does not reveal a structural or toxicologic cause for death    During the Seizure  - First, ensure adequate ventilation and place patients on the floor on their left side  Loosen clothing around the neck and ensure the airway is patent. If the patient is clenching the teeth, do not force the mouth open with any object as this can cause severe damage - Remove all items from the surrounding that can be hazardous. The patient may be oblivious to what's happening and may not even know what he or she is doing. If the patient is confused and wandering, either gently guide him/her away and block access to outside areas - Reassure the individual and be comforting - Call 911. In most cases, the seizure ends before EMS arrives. However, there are cases when seizures may last over 3 to 5 minutes. Or the individual may have developed breathing difficulties or severe injuries. If a pregnant patient  or a person with diabetes develops a seizure, it is prudent to call an ambulance. - Finally, if the patient does not regain full consciousness, then call EMS. Most patients will remain confused for about 45 to 90 minutes after a seizure, so you must use judgment in calling for help. - Avoid restraints but make sure the patient is in a bed with padded side rails - Place the individual in a lateral position with the neck slightly flexed; this will help the saliva drain from the mouth and prevent the tongue from falling backward - Remove all nearby furniture and other hazards from the area - Provide verbal assurance as the individual is regaining consciousness - Provide the patient with privacy if possible - Call for help and start treatment as ordered by the caregiver   After the Seizure (Postictal Stage)  After a seizure, most patients experience confusion, fatigue, muscle pain and/or a headache. Thus, one should permit the individual to sleep. For the next few days, reassurance is essential. Being calm and helping reorient the person is also of importance.  Most seizures are painless and end spontaneously. Seizures are not harmful to others but can lead to complications such as stress on the lungs, brain and the heart. Individuals with prior lung problems may develop labored breathing and respiratory distress.     Orders Placed This Encounter  Procedures   10-Hydroxycarbazepine   Basic Metabolic Panel    Meds ordered this encounter  Medications  amLODipine  (NORVASC ) 10 MG tablet    Sig: Take 1 tablet (10 mg total) by mouth daily.    Dispense:  90 tablet    Refill:  2   oxcarbazepine  (TRILEPTAL ) 600 MG tablet    Sig: Take 1.5 tablets (900 mg total) by mouth 2 (two) times daily.    Dispense:  120 tablet    Refill:  3    Return in about 6 months (around 04/16/2024).    Pastor Falling, MD 10/19/2023, 7:58 AM  [facility] [address] [place], [place]  [address] [phone]

## 2023-10-18 NOTE — Patient Instructions (Addendum)
 Continue with Oxcarbazepine  900 mg twice daily  Continue your other medications Discuss weight management  Will write him a letter to return to work  Return in 6 months or sooner if worse

## 2023-10-19 ENCOUNTER — Encounter: Payer: Self-pay | Admitting: Neurology

## 2023-10-19 ENCOUNTER — Ambulatory Visit: Payer: Medicaid Other | Attending: Internal Medicine | Admitting: Internal Medicine

## 2023-10-19 NOTE — Progress Notes (Deleted)
  Cardiology Office Note:  .   Date:  10/19/2023  ID:  Timothy Haynes, DOB 1991-01-15, MRN [id number] PCP: Pcp, No  [facility] Providers Cardiologist:  None { Click to update primary MD,subspecialty MD or APP then REFRESH:1}   History of Present Illness: .   Timothy Haynes is a 33 y.o. male ***  ROS: ***  Studies Reviewed: .        *** Risk Assessment/Calculations:   {Does this patient have ATRIAL FIBRILLATION?[phone]}         Physical Exam:   VS:  There were no vitals taken for this visit.   Wt Readings from Last 3 Encounters:  10/18/23 279 lb 8 oz (126.8 kg)  06/01/23 279 lb (126.6 kg)  05/19/23 279 lb (126.6 kg)    GEN: Well nourished, well developed in no acute distress NECK: No JVD; No carotid bruits CARDIAC: ***RRR, no murmurs, rubs, gallops RESPIRATORY:  Clear to auscultation without rales, wheezing or rhonchi  ABDOMEN: Soft, non-tender, non-distended EXTREMITIES:  No edema; No deformity   ASSESSMENT AND PLAN: .   EKG -Patient does not have ST elevation to suggest MI.  Has early repol this is benign       Dispo: ***  Signed, Oakley Kossman, Ronal BRAVO, MD

## 2023-10-22 LAB — 10-HYDROXYCARBAZEPINE: Oxcarbazepine SerPl-Mcnc: 19 ug/mL (ref 10–35)

## 2023-10-22 LAB — BASIC METABOLIC PANEL
BUN/Creatinine Ratio: 11 (ref 9–20)
BUN: 11 mg/dL (ref 6–20)
CO2: 20 mmol/L (ref 20–29)
Calcium: 9.8 mg/dL (ref 8.7–10.2)
Chloride: 107 mmol/L — ABNORMAL HIGH (ref 96–106)
Creatinine, Ser: 1 mg/dL (ref 0.76–1.27)
Glucose: 94 mg/dL (ref 70–99)
Potassium: 3.7 mmol/L (ref 3.5–5.2)
Sodium: 144 mmol/L (ref 134–144)
eGFR: 102 mL/min/{1.73_m2} (ref >59)

## 2023-10-23 ENCOUNTER — Encounter: Payer: Self-pay | Admitting: Neurology

## 2023-10-25 ENCOUNTER — Telehealth: Payer: Self-pay | Admitting: Neurology

## 2023-10-25 ENCOUNTER — Other Ambulatory Visit: Payer: Self-pay

## 2023-10-25 ENCOUNTER — Telehealth: Payer: Self-pay

## 2023-10-25 NOTE — Telephone Encounter (Signed)
Call to patient wife, she reports that pharmacy can't fill medication due to insurance stating it is too soon and patient will be out of medication tonight. Call to [facility] on [address], spoke with Nadara Eaton and he states insurance is basing refill off old script. He took verbal order for 10 day supply trileptal 600 mg, 1.5 tablets twice daily.( Total of 900 mg BID) He advised he would help mother when she got there to pick up  prescription with a savings card. He also stated the 10 day supply is $28.16. Mother was in the office and I spoke with her and wife on speaker phone and they are both in agreement and will reach out to insurance as well. They understand that full script can be filled on 10/31/23 but this needs to be resolved to rectify the insurance situation. They were also made aware that in the event it is ever needed, the emergency room may be able to supply emergency supply of medication. Wife and mother appreciative of help. They will call tomorrow to ensure they picked up medications.

## 2023-10-25 NOTE — Telephone Encounter (Signed)
Pt's wife has called to report that pt's mother has been informed by pharmacy that for the oxcarbazepine (TRILEPTAL) 600 MG tablet  a PA is needed.  Pt only has 1 pill left.  Spouse is asking what can be done since pt is out of the medication

## 2023-10-26 ENCOUNTER — Other Ambulatory Visit (HOSPITAL_COMMUNITY): Payer: Self-pay

## 2023-10-26 ENCOUNTER — Telehealth: Payer: Self-pay | Admitting: Pharmacy Technician

## 2023-10-26 ENCOUNTER — Telehealth: Payer: Self-pay

## 2023-10-26 NOTE — Telephone Encounter (Signed)
Pharmacy Patient Advocate Encounter   Received notification from Pt Calls Messages that prior authorization for Oxcarbazepine (Trileptal) 600 mg tablets is required/requested.   Insurance verification completed.   The patient is insured through [facility] .   Per test claim: Refill too soon. PA is not needed at this time. Medication was filled 08/24/2023. Next eligible fill date is 10/31/2023.

## 2023-10-26 NOTE — Telephone Encounter (Signed)
Call to wife, she states the got his trileptal and call insurance company and got the issue resolved, she was appreciative of call

## 2023-10-26 NOTE — Telephone Encounter (Signed)
As instructed by April rn its been handled in previous encounter

## 2023-12-08 ENCOUNTER — Encounter: Payer: Self-pay | Admitting: Neurology

## 2023-12-19 ENCOUNTER — Ambulatory Visit: Payer: Medicaid Other | Admitting: Neurology

## 2024-03-15 ENCOUNTER — Emergency Department (HOSPITAL_COMMUNITY)

## 2024-03-15 ENCOUNTER — Emergency Department (HOSPITAL_COMMUNITY): Admission: EM | Admit: 2024-03-15 | Discharge: 2024-03-15 | Disposition: A | Discharge status: Home / Self Care

## 2024-03-15 DIAGNOSIS — R000 Tachycardia, unspecified: Secondary | ICD-10-CM | POA: Diagnosis not present

## 2024-03-15 DIAGNOSIS — R410 Disorientation, unspecified: Secondary | ICD-10-CM | POA: Diagnosis not present

## 2024-03-15 DIAGNOSIS — R569 Unspecified convulsions: Secondary | ICD-10-CM | POA: Diagnosis present

## 2024-03-15 DIAGNOSIS — F05 Delirium due to known physiological condition: Secondary | ICD-10-CM

## 2024-03-15 LAB — BLOOD GAS, VENOUS
Acid-base deficit: 14.1 mmol/L — ABNORMAL HIGH (ref 0.0–2.0)
Bicarbonate: 7.4 mmol/L — ABNORMAL LOW (ref 20.0–28.0)
O2 Saturation: 99.9 %
Patient temperature: 37
pCO2, Ven: <18 mmHg — CL (ref 44–60)
pH, Ven: 7.4 (ref 7.25–7.43)
pO2, Ven: 82 mmHg — ABNORMAL HIGH (ref 32–45)

## 2024-03-15 LAB — CBC WITH DIFFERENTIAL/PLATELET
Abs Immature Granulocytes: 0.07 10*3/uL (ref 0.00–0.07)
Basophils Absolute: 0.1 10*3/uL (ref 0.0–0.1)
Basophils Relative: 0 %
Eosinophils Absolute: 0.1 10*3/uL (ref 0.0–0.5)
Eosinophils Relative: 1 %
HCT: 51.2 % (ref 39.0–52.0)
Hemoglobin: 16 g/dL (ref 13.0–17.0)
Immature Granulocytes: 1 %
Lymphocytes Relative: 32 %
Lymphs Abs: 3.9 10*3/uL (ref 0.7–4.0)
MCH: 30.8 pg (ref 26.0–34.0)
MCHC: 31.3 g/dL (ref 30.0–36.0)
MCV: 98.7 fL (ref 80.0–100.0)
Monocytes Absolute: 1 10*3/uL (ref 0.1–1.0)
Monocytes Relative: 8 %
Neutro Abs: 7.1 10*3/uL (ref 1.7–7.7)
Neutrophils Relative %: 58 %
Platelets: 234 10*3/uL (ref 150–400)
RBC: 5.19 MIL/uL (ref 4.22–5.81)
RDW: 13.8 % (ref 11.5–15.5)
WBC: 12.2 10*3/uL — ABNORMAL HIGH (ref 4.0–10.5)
nRBC: 0 % (ref 0.0–0.2)

## 2024-03-15 LAB — COMPREHENSIVE METABOLIC PANEL WITH GFR
ALT: 22 U/L (ref 0–44)
AST: 29 U/L (ref 15–41)
Albumin: 4.5 g/dL (ref 3.5–5.0)
Alkaline Phosphatase: 76 U/L (ref 38–126)
Anion gap: >20 — ABNORMAL HIGH (ref 5–15)
BUN: 8 mg/dL (ref 6–20)
CO2: 9 mmol/L — ABNORMAL LOW (ref 22–32)
Calcium: 9.1 mg/dL (ref 8.9–10.3)
Chloride: 111 mmol/L (ref 98–111)
Creatinine, Ser: 1.28 mg/dL — ABNORMAL HIGH (ref 0.61–1.24)
GFR, Estimated: >60 mL/min (ref >60)
Glucose, Bld: 123 mg/dL — ABNORMAL HIGH (ref 70–99)
Potassium: 3.8 mmol/L (ref 3.5–5.1)
Sodium: 144 mmol/L (ref 135–145)
Total Bilirubin: 0.6 mg/dL (ref 0.0–1.2)
Total Protein: 8.3 g/dL — ABNORMAL HIGH (ref 6.5–8.1)

## 2024-03-15 MED ORDER — LACOSAMIDE 100 MG PO TABS: 100.0000 mg | ORAL_TABLET | Freq: Two times a day (BID) | ORAL | 2 refills | Status: DC

## 2024-03-15 MED ORDER — ZIPRASIDONE MESYLATE 20 MG IM SOLR: 10.0000 mg | Freq: Once | INTRAMUSCULAR | Status: AC

## 2024-03-15 MED ORDER — LORAZEPAM 2 MG/ML IJ SOLN
2.0000 mg | Freq: Once | INTRAMUSCULAR | Status: AC
  Administered 2024-03-15: 2 mg via INTRAVENOUS

## 2024-03-15 MED ORDER — HALOPERIDOL LACTATE 5 MG/ML IJ SOLN
5.0000 mg | Freq: Once | INTRAMUSCULAR | Status: AC
  Administered 2024-03-15: 5 mg via INTRAVENOUS

## 2024-03-15 MED ORDER — LORAZEPAM 2 MG/ML IJ SOLN
INTRAMUSCULAR | Status: AC
  Filled 2024-03-15: qty 1

## 2024-03-15 MED ORDER — LORAZEPAM 2 MG/ML IJ SOLN
2.0000 mg | Freq: Once | INTRAMUSCULAR | Status: AC
  Administered 2024-03-15: 2 mg via INTRAVENOUS
  Filled 2024-03-15: qty 1

## 2024-03-15 MED ORDER — OXCARBAZEPINE 300 MG PO TABS
600.0000 mg | ORAL_TABLET | Freq: Once | ORAL | Status: AC
  Administered 2024-03-15: 600 mg via ORAL
  Filled 2024-03-15: qty 2

## 2024-03-15 MED ORDER — LEVETIRACETAM (KEPPRA) 500 MG/5 ML ADULT IV PUSH
1500.0000 mg | Freq: Once | INTRAVENOUS | Status: AC
  Administered 2024-03-15: 1500 mg via INTRAVENOUS
  Filled 2024-03-15: qty 15

## 2024-03-15 MED ORDER — STERILE WATER FOR INJECTION IJ SOLN
INTRAMUSCULAR | Status: AC
  Administered 2024-03-15: 1.2 mL
  Filled 2024-03-15: qty 10

## 2024-03-15 MED ORDER — HALOPERIDOL LACTATE 5 MG/ML IJ SOLN
INTRAMUSCULAR | Status: AC
  Filled 2024-03-15: qty 1

## 2024-03-15 MED ORDER — ZIPRASIDONE MESYLATE 20 MG IM SOLR
INTRAMUSCULAR | Status: AC
  Administered 2024-03-15: 10 mg via INTRAMUSCULAR
  Filled 2024-03-15: qty 20

## 2024-03-15 NOTE — ED Provider Notes (Signed)
 [facility] Provider Note   CSN: [id number] Arrival date & time: 03/15/24  1138     Patient presents with: Seizures (Prior history of seizures, takes medication, seizure happened in EMS)   Timothy Haynes is a 33 y.o. male.   HPI   Presents due to seizure activity.  According to EMS, they were called because of seizure at home.  Patient had a seizure at home the last about 1 minute.  On the way to the hospital, patient had another seizure that lasted about 45 minutes.  Patient received 5 mg IM Versed  at the beginning of the seizure on the way to the hospital.  I was subsequent called out to the ambulance bay upon arrival.  EMS states that he has been compliant with his medication per his own report.  Was alert and oriented x 3 with a GCS of 15 on the way to the hospital until he started seizing.  Previous medical history reviewed : She was last in the ED October 2024.  History of epilepsy.  On Trileptal .  History of noncompliance with medication documented.   Prior to Admission medications   Medication Sig Start Date End Date Taking? Authorizing Provider  Lacosamide 100 MG TABS Take 1 tablet (100 mg total) by mouth in the morning and at bedtime. 03/15/24 04/14/24 Yes Simon Lavonia SAILOR, MD  amLODipine  (NORVASC ) 10 MG tablet Take 1 tablet (10 mg total) by mouth daily. 10/18/23 01/16/24  Camara, Amadou, MD  diazePAM , 20 MG Dose, (VALTOCO  20 MG DOSE) 2 x 10 MG/0.1ML LQPK Place 1 spray into the nose as needed (For seizure lasting over 2 mins). 12/31/22   Danford, Lonni SQUIBB, MD  oxcarbazepine  (TRILEPTAL ) 600 MG tablet Take 1.5 tablets (900 mg total) by mouth 2 (two) times daily. 10/18/23 10/12/24  Camara, Amadou, MD  oxyCODONE -acetaminophen  (PERCOCET) 5-325 MG tablet Take 1 tablet by mouth every 4 (four) hours as needed for severe pain. 05/19/23   Sheryle Hamilton, DMD    Allergies: Patient has no known allergies.    Review of Systems  Updated Vital Signs BP  (!) 145/83   Pulse 85   Temp (!) 97.5 F (36.4 C) (Axillary)   Resp (!) 22   SpO2 100%   Physical Exam Constitutional:      Appearance: He is ill-appearing and diaphoretic.  HENT:     Head: Normocephalic.     Nose: No congestion.  Eyes:     Pupils: Pupils are equal, round, and reactive to light.  Cardiovascular:     Rate and Rhythm: Tachycardia present.  Pulmonary:     Effort: No respiratory distress.  Abdominal:     General: Abdomen is flat.     Palpations: Abdomen is soft.  Musculoskeletal:        General: Normal range of motion.  Skin:    General: Skin is warm.  Neurological:     Mental Status: He is disoriented.     (all labs ordered are listed, but only abnormal results are displayed) Labs Reviewed  COMPREHENSIVE METABOLIC PANEL WITH GFR - Abnormal; Notable for the following components:      Result Value   CO2 9 (*)    Glucose, Bld 123 (*)    Creatinine, Ser 1.28 (*)    Total Protein 8.3 (*)    Anion gap >20 (*)    All other components within normal limits  CBC WITH DIFFERENTIAL/PLATELET - Abnormal; Notable for the following components:  WBC 12.2 (*)    All other components within normal limits  OXCARBAZEPINE  (TRILEPTAL ), SERUM  ETHANOL  I-STAT VENOUS BLOOD GAS, ED    EKG: EKG Interpretation Date/Time:  Thursday March 15 2024 13:40:20 EDT Ventricular Rate:  94 PR Interval:  169 QRS Duration:  96 QT Interval:  363 QTC Calculation: 454 R Axis:   60  Text Interpretation: Sinus rhythm Borderline repolarization abnormality Confirmed by Simon Rea [phone]) on 03/15/2024 2:25:49 PM  Radiology: CT Head Wo Contrast Result Date: 03/15/2024 CLINICAL DATA:  seizure with headache EXAM: CT HEAD WITHOUT CONTRAST TECHNIQUE: Contiguous axial images were obtained from the base of the skull through the vertex without intravenous contrast. RADIATION DOSE REDUCTION: This exam was performed according to the departmental dose-optimization program which includes automated  exposure control, adjustment of the mA and/or kV according to patient size and/or use of iterative reconstruction technique. COMPARISON:  CT the head dated June 01, 2023. FINDINGS: Brain: Normal brain. No evidence of hemorrhage, mass, cortical infarct or hydrocephalus. Prominent CSF space within the posteromedial aspect of the right posterior fossa, possibly representing arachnoid cyst. Vascular: Negative. Skull: Normal. Sinuses/Orbits: Normal. Other: None. IMPRESSION: Questionable arachnoid cyst in the right posterior fossa. Otherwise, negative. Electronically Signed   By: Evalene Coho M.D.   On: 03/15/2024 15:33     Procedures   Medications Ordered in the ED  levETIRAcetam  (KEPPRA ) undiluted injection 1,500 mg (1,500 mg Intravenous Given 03/15/24 1202)  LORazepam  (ATIVAN ) injection 2 mg (2 mg Intravenous Given 03/15/24 1201)  LORazepam  (ATIVAN ) injection 2 mg (2 mg Intravenous Given 03/15/24 1217)  haloperidol lactate (HALDOL) injection 5 mg (5 mg Intravenous Given 03/15/24 1221)  ziprasidone (GEODON) injection 10 mg (10 mg Intramuscular Given 03/15/24 1349)  sterile water (preservative free) injection (1.2 mLs  Given 03/15/24 1348)    Clinical Course as of 03/15/24 1637  Thu Mar 15, 2024  1512        Sig: Take 1.5 tablets (900 mg total) by mouth 2 (two) times daily.        [TL]  1629 Continue trileptal     lacosamide 100 mg BID  [TL]    Clinical Course User Index [TL] Simon Rea SAILOR, MD                                 Medical Decision Making Amount and/or Complexity of Data Reviewed Labs: ordered. Radiology: ordered.  Risk Prescription drug management.     Presents due to seizure activity.  According to EMS, they were called because of seizure at home.  Patient had a seizure at home the last about 1 minute.  On the way to the hospital, patient had another seizure that lasted about 45 minutes.  Patient received 5 mg IM Versed  at the beginning of the seizure on the way to  the hospital.  I was subsequent called out to the ambulance bay upon arrival.  EMS states that he has been compliant with his medication per his own report.  Was alert and oriented x 3 with a GCS of 15 on the way to the hospital until he started seizing.  Previous medical history reviewed : She was last in the ED October 2024.  History of epilepsy.  On Trileptal .  History of noncompliance with medication documented.  Called out to the ambulance bay due to concern for seizure activity lasting approximate 4-5 minutes.  Upon exam, patient had stopped seizing.  Was  open his eyes to command but still diaphoretic and confused.  Clearly postictal.  No obvious  focal neurologic deficit.  Was brought back to resuscitation room.  Patient continued to be postictal confused.  He was redirectable for short amount of time but then would become confused again.  No focal deficits that I can appreciate.  Because of agitation in the setting of postictal state, I did have to give her more Ativan  as well as ended up having give Haldol given his agitated state.  He was a danger to himself as well as others by try to get up and pull on his restraints and pull out his IVs.  Placed him on capnography as well as O2 monitoring.  Will obtain CT scan while he is calm.  No obvious seizure activity at this point time.   Had received multiple medications to help him calm down in the setting of his agitation with his postictal state.  Had received Haldol as well as Geodon.  5 mg x 2 of Haldol as well as 10 mg IM Geodon.  Received Ativan  IV as well.  Monitor his QTc.  QTc did not prolong.  Repeated EKGs which showed normal QTc.  Able to obtain CT scan after multiple medication pushes.  Unfortunately, patient had to have a soft restraints placed as well because he was a danger to himself as well as others.  Considered intubating the patient but ultimately was able to hold off on this.  Able to obtain CT scan which showed no acute pathology  such as any kind of bleed.  Does show questionable arachnoid cyst which can follow-up with neurology for outpatient.  Acidosis on labs including anion gap elevation in the setting of seizure activity.  No concern for any kind of infectious etiology at this point time.  Spoke to Dr. Matthews from neurology.  Recommended lacosamide 100 mg twice daily as well as his ongoing Trileptal  use 900 mg twice daily.  He will need to follow-up outpatient with his neurologist.  No need for inpatient monitoring at this point time.  No need for continuous EEG.  Plan will be to continue to monitor the patient until is able to walk around in a steady manner.  He still sleepy this point time given all the medication that was given.    CRITICAL CARE Performed by: Lavonia LOISE Pat   Total critical care time: 120 minutes  Critical care time was exclusive of separately billable procedures and treating other patients.  Critical care was necessary to treat or prevent imminent or life-threatening deterioration.  Critical care was time spent personally by me on the following activities: development of treatment plan with patient and/or surrogate as well as nursing, discussions with consultants, evaluation of patient's response to treatment, examination of patient, obtaining history from patient or surrogate, ordering and performing treatments and interventions, ordering and review of laboratory studies, ordering and review of radiographic studies, pulse oximetry and re-evaluation of patient's condition.       Final diagnoses:  Seizure [facility])  Post-ictal confusion    ED Discharge Orders          Ordered    Lacosamide 100 MG TABS  2 times daily        03/15/24 1632               Pat Lavonia LOISE, MD 03/15/24 1712

## 2024-03-15 NOTE — ED Provider Notes (Signed)
  Physical Exam  BP 115/70   Pulse 77   Temp 98.3 F (36.8 C) (Oral)   Resp 18   SpO2 100%   Physical Exam  Procedures  Procedures  ED Course / MDM   Clinical Course as of 03/15/24 1846  Thu Mar 15, 2024  1512        Sig: Take 1.5 tablets (900 mg total) by mouth 2 (two) times daily.        [TL]  1629 Continue trileptal     lacosamide 100 mg BID  [TL]    Clinical Course User Index [TL] Simon Lavonia SAILOR, MD   Medical Decision Making Amount and/or Complexity of Data Reviewed Labs: ordered. Radiology: ordered.  Risk Prescription drug management.   Patient mental status improving but still sedate.  Will continue to monitor  Now patient much more awake.  Has ambulated.  Has eaten.  Will discharge with patient's wife.       Patsey Lot, MD 03/15/24 2132

## 2024-03-15 NOTE — ED Triage Notes (Incomplete)
 BIB [facility] for seizure. Pt has a prior hx of seizures and takes medication. Seizure happened in ambulance bay at hospital.

## 2024-03-15 NOTE — ED Notes (Signed)
 Tried CT for a 2nd time, pt still agitated and fighting staff.

## 2024-03-15 NOTE — ED Notes (Signed)
 Tolorated water okay

## 2024-03-15 NOTE — Discharge Instructions (Signed)
 Start the new medicine.  Follow-up with neurology.

## 2024-03-20 ENCOUNTER — Telehealth: Payer: Self-pay | Admitting: Neurology

## 2024-03-20 NOTE — Telephone Encounter (Signed)
 Pt stated that 30 mins ago he thinks that he had a focal sz. Pt stated that he needs refill of the valtoco  due to using the last of it Thursday 03/15/24. It will not allow me to order within this encounter but please send to [facility] on [address]

## 2024-03-20 NOTE — Telephone Encounter (Signed)
 Called and spoke to pt who stated that he had a sz on 03/15/24. Pt stated that they didn't miss any sz meds. Was seen at er and would like to know if they should be seen sooner. Pt was dizzy and had blurry vision at the time before the sz. I will route to provider and see if they would like pt to come for sooner appt or change medication regimen

## 2024-03-20 NOTE — Telephone Encounter (Signed)
 Patient said laid down and was trying to go to sleep. Next thing I remember woke up on the floor with all the kids in the room. Kids said during the seizure was foaming at the mouth. My nephew administered the nose spray. Called 911, transported to [facility], on the way had another seizure in the ambulance. Would like a call back to discuss an earlier appointment.

## 2024-03-22 ENCOUNTER — Other Ambulatory Visit: Payer: Self-pay | Admitting: Neurology

## 2024-03-22 LAB — OXCARBAZEPINE (TRILEPTAL), SERUM: Oxcarbazepine Metabolite: 16 ug/mL (ref 10–35)

## 2024-03-22 MED ORDER — LACOSAMIDE 150 MG PO TABS: 150.0000 mg | ORAL_TABLET | Freq: Two times a day (BID) | ORAL | 5 refills | Status: DC

## 2024-03-22 MED ORDER — VALTOCO 20 MG DOSE 2 X 10 MG/0.1ML NA LQPK: 20.0000 mg | NASAL | 5 refills | Status: AC | PRN

## 2024-03-22 NOTE — Telephone Encounter (Signed)
 Please inform patient that Valtoco  has been sent to pharmacy and to also increase the Lacosamide  to 150 mg twice daily. New prescription have been sent to the pharmacy.

## 2024-03-23 NOTE — Telephone Encounter (Signed)
 Lvm 1st attempt by hf 03/23/24

## 2024-03-26 NOTE — Telephone Encounter (Signed)
 Called and spoke to pt and relayed Dr. Janean message:  Valtoco  has been sent to pharmacy and to also increase the Lacosamide  to 150 mg twice daily. New prescription have been sent to the pharmacy.   Pt voiced gratitude and understanding of all discussed

## 2024-03-27 ENCOUNTER — Ambulatory Visit (INDEPENDENT_AMBULATORY_CARE_PROVIDER_SITE_OTHER): Admitting: Neurology

## 2024-03-27 ENCOUNTER — Encounter: Payer: Self-pay | Admitting: Neurology

## 2024-03-27 VITALS — BP 130/82 | Ht 64.0 in | Wt 262.0 lb

## 2024-03-27 DIAGNOSIS — G40909 Epilepsy, unspecified, not intractable, without status epilepticus: Secondary | ICD-10-CM | POA: Diagnosis not present

## 2024-03-27 MED ORDER — OXCARBAZEPINE 600 MG PO TABS
900.0000 mg | ORAL_TABLET | Freq: Two times a day (BID) | ORAL | 3 refills | Status: DC
Start: 2024-03-27 — End: 2024-07-30

## 2024-03-27 NOTE — Progress Notes (Signed)
 [facility]  PATIENT: Timothy Haynes DOB: 24-Jun-1991  REQUESTING CLINICIAN: Patsey Lot, MD HISTORY FROM: Patient and family  REASON FOR VISIT: New onset seizures    HISTORICAL  CHIEF COMPLAINT:  Chief Complaint  Patient presents with   Follow-up    Rm 13, sz, follow up, sz last week, 2 GTC, reports mini sz today, medication compliance, denies SI/HI    INTERVAL HISTORY 03/27/2024 Patient presents today for follow-up, last visit was in February at that time we continued him on oxcarbazepine  900 mg twice daily.  He did have breakthrough seizures in July requiring hospitalization.  After that visit, he was recommended to start Vimpat  150 mg twice daily with his oxcarbazepine  but he did not start the Vimpat .  He was taking oxcarbazepine  alone until yesterday when he ran out then start the Vimpat  this morning.  During this time he reports some mini seizure that he described as sweating and confusion.  His last convulsion was on March 15 2024   INTERVAL HISTORY 10/18/2023:  [name] presents today for follow up. He is accompanied by spouse. Last visit was in June, at that time we have increase his Trileptal  to 900 mg BID. He reports doing well until having a breakthrough seizure on September 18. Denies any provoking factors as he reports compliance with medication. Since then, he has been doing well, no other complaints of concerns.  They reports depression and irritability due to the fact that he has not been working. He would like to go back to work.     INTERVAL HISTORY 03/02/2023:  Patient presents today for follow-up, he is accompanied by wife.  Last visit was in March, at that time we have switched his Keppra  to oxcarbazepine  due to side effect from Keppra .  Unfortunately he did have a seizure on April 18, a total of 3 seizures that day.  His oxcarbazepine  dose was increased from 300 to 600 mg twice daily.  Since then he has been doing well, denies any side effect  from the medication.  Wife reports that his mood is much better since being off Keppra .  He would like to go back to work.  No other complaints no other concerns.    HISTORY OF PRESENT ILLNESS:  This is a 33 year old gentleman with no reported past medical history who is presenting for new onset epilepsy.  Patient reports going to bed the night of January 31 and waking up on the floor with tongue biting.  He was confused, he presented to the ED and there was a suspicion for seizures.  He was recommended to start Keppra  500 mg twice daily and discharged home.  At that time he reports that he did not take the Keppra .  On February 23 he had another event witnessed by wife.  Again it was during sleep and wife woke up in bed shaking.  He was also making a grunting noise.  He did have tongue biting and the EMS was called.  Seizure was described as generalized convulsion. Wife reports patient was confused initially.  In the ED workup unrevealing, he was recommended Keppra  500 mg twice daily.  Patient reports since discharge from the hospital he has been compliant with the Keppra  500 mg twice daily, denies any seizure or seizure activity.  Denies any side effect from the medicine.  He denies any seizure risk factors, denies any family history of seizures.   Handedness Right handed    Onset: January 31  Seizure Type: generalized convulsion  Current frequency: Last convulsion March 15 2024  Any injuries from seizures: Tongue biting   Seizure risk factors: None   Previous ASMs: levetiracetam , Oxcarbazepine     Currenty ASMs: Oxcarbazepine  900 mg twice daily   ASMs side effects: Mood swing, and irritability with Keppra .  Brain Images: Normal head CT   Previous EEGs: Left temporal epileptiform discharges    OTHER MEDICAL CONDITIONS: None reported   REVIEW OF SYSTEMS: Full 14 system review of systems performed and negative with exception of: As noted in the HPI   ALLERGIES: No Known  Allergies  HOME MEDICATIONS: Outpatient Medications Prior to Visit  Medication Sig Dispense Refill   diazePAM , 20 MG Dose, (VALTOCO  20 MG DOSE) 2 x 10 MG/0.1ML LQPK Place 20 mg into the nose as needed (for seizure lasting more than 2 minutes). 5 each 5   Lacosamide  150 MG TABS Take 1 tablet (150 mg total) by mouth 2 (two) times daily. 60 tablet 5   amLODipine  (NORVASC ) 10 MG tablet Take 1 tablet (10 mg total) by mouth daily. (Patient not taking: Reported on 03/27/2024) 90 tablet 2   oxyCODONE -acetaminophen  (PERCOCET) 5-325 MG tablet Take 1 tablet by mouth every 4 (four) hours as needed for severe pain. (Patient not taking: Reported on 03/27/2024) 18 tablet 0   oxcarbazepine  (TRILEPTAL ) 600 MG tablet Take 1.5 tablets (900 mg total) by mouth 2 (two) times daily. (Patient not taking: Reported on 03/27/2024) 120 tablet 3   No facility-administered medications prior to visit.    PAST MEDICAL HISTORY: Past Medical History:  Diagnosis Date   Seizures (HCC)     PAST SURGICAL HISTORY: Past Surgical History:  Procedure Laterality Date   FOOT SURGERY     TOOTH EXTRACTION N/A 05/19/2023   Procedure: DENTAL EXTRACTIONS NUMBER TWO, SIXTEEN, SEVENTEEN, THIRTY-ONE, THIRTY-TWO;  Surgeon: Sheryle Hamilton, DMD;  Location: [facility] OR;  Service: Oral Surgery;  Laterality: N/A;    FAMILY HISTORY: Family History  Problem Relation Age of Onset   Hypertension Mother    Hypertension Father    Kidney disease Father    Diabetes Maternal Grandmother     SOCIAL HISTORY: Social History   Socioeconomic History   Marital status: Single    Spouse name: Not on file   Number of children: Not on file   Years of education: Not on file   Highest education level: Not on file  Occupational History   Not on file  Tobacco Use   Smoking status: Every Day    Current packs/day: 0.50    Types: Cigarettes   Smokeless tobacco: Never  Vaping Use   Vaping status: Never Used  Substance and Sexual Activity   Alcohol use: Yes     Comment: occ   Drug use: No   Sexual activity: Yes    Comment: married and monagomous  Other Topics Concern   Not on file  Social History Narrative   Right handed   No caffeine   Lives at home with wife      Social Drivers of Corporate investment banker Strain: Not on file  Food Insecurity: No Food Insecurity (12/30/2022)   Hunger Vital Sign    Worried About Running Out of Food in the Last Year: Never true    Ran Out of Food in the Last Year: Never true  Transportation Needs: No Transportation Needs (12/30/2022)   PRAPARE - Administrator, Civil Service (Medical): No    Lack of Transportation (Non-Medical): No  Physical Activity: Not  on file  Stress: Not on file  Social Connections: Not on file  Intimate Partner Violence: Not At Risk (12/30/2022)   Humiliation, Afraid, Rape, and Kick questionnaire    Fear of Current or Ex-Partner: No    Emotionally Abused: No    Physically Abused: No    Sexually Abused: No     PHYSICAL EXAM   GENERAL EXAM/CONSTITUTIONAL: Vitals:  Vitals:   03/27/24 1444  BP: 130/82  Weight: 262 lb (118.8 kg)  Height: 5' 4 (1.626 m)   Body mass index is 44.97 kg/m. Wt Readings from Last 3 Encounters:  03/27/24 262 lb (118.8 kg)  10/18/23 279 lb 8 oz (126.8 kg)  06/01/23 279 lb (126.6 kg)   Patient is in no distress; well developed, nourished and groomed; neck is supple, Obese gentleman   MUSCULOSKELETAL: Gait, strength, tone, movements noted in Neurologic exam below  NEUROLOGIC: MENTAL STATUS:      No data to display         awake, alert, oriented to person, place and time recent and remote memory intact normal attention and concentration language fluent, comprehension intact, naming intact fund of knowledge appropriate  CRANIAL NERVE:  2nd, 3rd, 4th, 6th - Visual fields full to confrontation, extraocular muscles intact, no nystagmus 5th - facial sensation symmetric 7th - facial strength symmetric 8th - hearing  intact 9th - palate elevates symmetrically, uvula midline 11th - shoulder shrug symmetric 12th - tongue protrusion midline  MOTOR:  normal bulk and tone, full strength in the BUE, BLE  SENSORY:  normal and symmetric to light touch  COORDINATION:  finger-nose-finger, fine finger movements normal  GAIT/STATION:  normal    DIAGNOSTIC DATA (LABS, IMAGING, TESTING) - I reviewed patient records, labs, notes, testing and imaging myself where available.  Lab Results  Component Value Date   WBC 12.2 (H) 03/15/2024   HGB 16.0 03/15/2024   HCT 51.2 03/15/2024   MCV 98.7 03/15/2024   PLT 234 03/15/2024      Component Value Date/Time   NA 144 03/15/2024 1140   NA 144 10/18/2023 1403   K 3.8 03/15/2024 1140   CL 111 03/15/2024 1140   CO2 9 (L) 03/15/2024 1140   GLUCOSE 123 (H) 03/15/2024 1140   BUN 8 03/15/2024 1140   BUN 11 10/18/2023 1403   CREATININE 1.28 (H) 03/15/2024 1140   CREATININE 0.86 09/29/2012 1201   CALCIUM 9.1 03/15/2024 1140   PROT 8.3 (H) 03/15/2024 1140   ALBUMIN 4.5 03/15/2024 1140   AST 29 03/15/2024 1140   ALT 22 03/15/2024 1140   ALKPHOS 76 03/15/2024 1140   BILITOT 0.6 03/15/2024 1140   GFRNONAA >60 03/15/2024 1140   GFRAA >60 06/06/2016 1815   Lab Results  Component Value Date   CHOL 124 02/06/2010   HDL 34 (L) 02/06/2010   LDLCALC 81 02/06/2010   TRIG 45 02/06/2010   No results found for: HGBA1C No results found for: VITAMINB12 Lab Results  Component Value Date   TSH 3.444 02/06/2010    Head CT 10/13/2022 Unremarkable head CT with no acute intracranial pathology.   MRI Brain 12/30/2022:  No acute intracranial process. No seizure etiology is identified.   EEG 11/24/2022 Frequent left temporal sharp and slow wave discharges   I personally reviewed brain Images   ASSESSMENT AND PLAN  33 y.o. year old male  with no reported past medical history who is presenting for follow up for his focal epilepsy.  He is on Oxcarbazepine  900 mg  twice daily but after his last seizure on March 15, 2024 he was recommended to start lacosamide  150 mg twice daily.  Due to misunderstanding, he was only taking the oxcarbazepine  and took his lacosamide  this morning once he ran out of oxcarbazepine  yesterday.  I informed patient that he should be on both medications oxcarbazepine  900 mg twice daily with Lacosamide  150 mg twice daily.  He voiced understanding.  I will send refills.  I will see him in 3 months for follow-up but he understands to contact me sooner if he does have any breakthrough seizure.  Return sooner if worse   1. Nonintractable epilepsy without status epilepticus, unspecified epilepsy type [facility])       Patient Instructions  Continue with Lacosamide  150 mg twice daily  Continue with oxcarbazepine  900 mg twice daily Continue other medication Please contact me if you do have a breakthrough seizure Follow-up in 3 months or sooner if worse.   Per [place] statutes, patients with seizures are not allowed to drive until they have been seizure-free for six months.  Other recommendations include using caution when using heavy equipment or power tools. Avoid working on ladders or at heights. Take showers instead of baths.  Do not swim alone.  Ensure the water  temperature is not too high on the home water  heater. Do not go swimming alone. Do not lock yourself in a room alone (i.e. bathroom). When caring for infants or small children, sit down when holding, feeding, or changing them to minimize risk of injury to the child in the event you have a seizure. Maintain good sleep hygiene. Avoid alcohol.  Also recommend adequate sleep, hydration, good diet and minimize stress.   Discussed Patients with epilepsy have a small risk of sudden unexpected death, a condition referred to as sudden unexpected death in epilepsy (SUDEP). SUDEP is defined specifically as the sudden, unexpected, witnessed or unwitnessed, nontraumatic and nondrowning  death in patients with epilepsy with or without evidence for a seizure, and excluding documented status epilepticus, in which post mortem examination does not reveal a structural or toxicologic cause for death    During the Seizure  - First, ensure adequate ventilation and place patients on the floor on their left side  Loosen clothing around the neck and ensure the airway is patent. If the patient is clenching the teeth, do not force the mouth open with any object as this can cause severe damage - Remove all items from the surrounding that can be hazardous. The patient may be oblivious to what's happening and may not even know what he or she is doing. If the patient is confused and wandering, either gently guide him/her away and block access to outside areas - Reassure the individual and be comforting - Call 911. In most cases, the seizure ends before EMS arrives. However, there are cases when seizures may last over 3 to 5 minutes. Or the individual may have developed breathing difficulties or severe injuries. If a pregnant patient or a person with diabetes develops a seizure, it is prudent to call an ambulance. - Finally, if the patient does not regain full consciousness, then call EMS. Most patients will remain confused for about 45 to 90 minutes after a seizure, so you must use judgment in calling for help. - Avoid restraints but make sure the patient is in a bed with padded side rails - Place the individual in a lateral position with the neck slightly flexed; this will help the saliva drain from  the mouth and prevent the tongue from falling backward - Remove all nearby furniture and other hazards from the area - Provide verbal assurance as the individual is regaining consciousness - Provide the patient with privacy if possible - Call for help and start treatment as ordered by the caregiver   After the Seizure (Postictal Stage)  After a seizure, most patients experience confusion, fatigue,  muscle pain and/or a headache. Thus, one should permit the individual to sleep. For the next few days, reassurance is essential. Being calm and helping reorient the person is also of importance.  Most seizures are painless and end spontaneously. Seizures are not harmful to others but can lead to complications such as stress on the lungs, brain and the heart. Individuals with prior lung problems may develop labored breathing and respiratory distress.     No orders of the defined types were placed in this encounter.   Meds ordered this encounter  Medications   oxcarbazepine  (TRILEPTAL ) 600 MG tablet    Sig: Take 1.5 tablets (900 mg total) by mouth 2 (two) times daily.    Dispense:  270 tablet    Refill:  3    Return in about 4 months (around 07/13/2024).  The patient's condition of epilepsy requires frequent monitoring and adjustments in the treatment plan, reflecting the ongoing complexity of care.  This provider is the continuing focal point for all needed services for this condition.   Pastor Falling, MD 03/27/2024, 3:12 PM  [facility] [address], [address] [place], [place] [address] [phone]

## 2024-03-27 NOTE — Patient Instructions (Signed)
 Continue with Lacosamide  150 mg twice daily  Continue with oxcarbazepine  900 mg twice daily Continue other medication Please contact me if you do have a breakthrough seizure Follow-up in 3 months or sooner if worse.

## 2024-04-30 ENCOUNTER — Ambulatory Visit: Payer: Medicaid Other | Admitting: Neurology

## 2024-07-13 ENCOUNTER — Encounter: Payer: Self-pay | Admitting: Neurology

## 2024-07-13 ENCOUNTER — Ambulatory Visit: Admitting: Neurology

## 2024-07-23 ENCOUNTER — Ambulatory Visit: Admitting: Neurology

## 2024-07-27 ENCOUNTER — Other Ambulatory Visit: Payer: Self-pay | Admitting: Neurology

## 2024-09-20 ENCOUNTER — Other Ambulatory Visit: Payer: Self-pay | Admitting: Neurology

## 2024-09-20 ENCOUNTER — Telehealth: Payer: Self-pay | Admitting: Neurology

## 2024-09-20 NOTE — Telephone Encounter (Signed)
 Pt is requesting a refill for oxcarbazepine  (TRILEPTAL ) 600 MG tablet Lacosamide  150 MG TABS  .  Pharmacy: [facility] [phone]

## 2024-09-21 ENCOUNTER — Other Ambulatory Visit: Payer: Self-pay

## 2024-09-21 NOTE — Telephone Encounter (Signed)
 Prescription sent awaiting approve from MD.

## 2024-09-21 NOTE — Telephone Encounter (Signed)
 Timothy Haynes

## 2024-09-24 MED ORDER — LACOSAMIDE 150 MG PO TABS: 150.0000 mg | ORAL_TABLET | Freq: Two times a day (BID) | ORAL | 3 refills | Status: AC

## 2024-09-24 NOTE — Addendum Note (Signed)
 Addended byBETHA GREGG LEK on: 09/24/2024 09:58 AM   Modules accepted: Orders

## 2025-04-18 ENCOUNTER — Ambulatory Visit: Admitting: Neurology
